# Patient Record
Sex: Female | Born: 1980 | Race: Black or African American | Hispanic: No | Marital: Married | Smoking: Current every day smoker
Health system: Southern US, Community
[De-identification: ages and names within clinical notes are randomized; demographics above are authoritative.]

## PROBLEM LIST (undated history)

## (undated) ENCOUNTER — Inpatient Hospital Stay (HOSPITAL_COMMUNITY): Payer: Self-pay

## (undated) DIAGNOSIS — IMO0002 Reserved for concepts with insufficient information to code with codable children: Secondary | ICD-10-CM

## (undated) DIAGNOSIS — R87619 Unspecified abnormal cytological findings in specimens from cervix uteri: Secondary | ICD-10-CM

## (undated) DIAGNOSIS — R51 Headache: Secondary | ICD-10-CM

## (undated) DIAGNOSIS — B009 Herpesviral infection, unspecified: Secondary | ICD-10-CM

## (undated) DIAGNOSIS — D219 Benign neoplasm of connective and other soft tissue, unspecified: Secondary | ICD-10-CM

## (undated) DIAGNOSIS — B999 Unspecified infectious disease: Secondary | ICD-10-CM

## (undated) DIAGNOSIS — N83209 Unspecified ovarian cyst, unspecified side: Secondary | ICD-10-CM

## (undated) DIAGNOSIS — H3320 Serous retinal detachment, unspecified eye: Secondary | ICD-10-CM

## (undated) HISTORY — PX: TONSILLECTOMY: SUR1361

## (undated) HISTORY — PX: MYOMECTOMY: SHX85

## (undated) HISTORY — PX: OTHER SURGICAL HISTORY: SHX169

---

## 2006-02-12 ENCOUNTER — Inpatient Hospital Stay (HOSPITAL_COMMUNITY): Admission: AD | Admit: 2006-02-12 | Discharge: 2006-02-12 | Payer: Self-pay | Admitting: Obstetrics and Gynecology

## 2006-03-03 ENCOUNTER — Inpatient Hospital Stay (HOSPITAL_COMMUNITY): Admission: AD | Admit: 2006-03-03 | Discharge: 2006-03-03 | Payer: Self-pay | Admitting: Obstetrics & Gynecology

## 2006-03-04 ENCOUNTER — Inpatient Hospital Stay (HOSPITAL_COMMUNITY): Admission: AD | Admit: 2006-03-04 | Discharge: 2006-03-05 | Payer: Self-pay | Admitting: Obstetrics and Gynecology

## 2006-03-04 ENCOUNTER — Encounter (INDEPENDENT_AMBULATORY_CARE_PROVIDER_SITE_OTHER): Payer: Self-pay | Admitting: Specialist

## 2006-03-11 ENCOUNTER — Inpatient Hospital Stay (HOSPITAL_COMMUNITY): Admission: RE | Admit: 2006-03-11 | Discharge: 2006-03-11 | Payer: Self-pay | Admitting: Obstetrics and Gynecology

## 2006-03-18 ENCOUNTER — Inpatient Hospital Stay (HOSPITAL_COMMUNITY): Admission: AD | Admit: 2006-03-18 | Discharge: 2006-03-18 | Payer: Self-pay | Admitting: Obstetrics and Gynecology

## 2006-11-11 ENCOUNTER — Emergency Department (HOSPITAL_COMMUNITY): Admission: EM | Admit: 2006-11-11 | Discharge: 2006-11-11 | Payer: Self-pay | Admitting: Emergency Medicine

## 2007-04-21 ENCOUNTER — Emergency Department (HOSPITAL_COMMUNITY): Admission: EM | Admit: 2007-04-21 | Discharge: 2007-04-21 | Payer: Self-pay | Admitting: Emergency Medicine

## 2008-01-05 ENCOUNTER — Emergency Department (HOSPITAL_COMMUNITY): Admission: EM | Admit: 2008-01-05 | Discharge: 2008-01-06 | Payer: Self-pay | Admitting: Emergency Medicine

## 2008-04-29 ENCOUNTER — Other Ambulatory Visit: Admission: RE | Admit: 2008-04-29 | Discharge: 2008-04-29 | Payer: Self-pay | Admitting: Obstetrics and Gynecology

## 2009-07-11 ENCOUNTER — Emergency Department (HOSPITAL_COMMUNITY): Admission: EM | Admit: 2009-07-11 | Discharge: 2009-07-11 | Payer: Self-pay | Admitting: Family Medicine

## 2009-07-11 ENCOUNTER — Emergency Department (HOSPITAL_COMMUNITY): Admission: EM | Admit: 2009-07-11 | Discharge: 2009-07-11 | Payer: Self-pay | Admitting: Emergency Medicine

## 2009-07-12 ENCOUNTER — Emergency Department (HOSPITAL_COMMUNITY): Admission: EM | Admit: 2009-07-12 | Discharge: 2009-07-12 | Payer: Self-pay | Admitting: Emergency Medicine

## 2009-07-23 ENCOUNTER — Encounter: Admission: RE | Admit: 2009-07-23 | Discharge: 2009-07-23 | Payer: Self-pay | Admitting: Family Medicine

## 2009-08-03 ENCOUNTER — Other Ambulatory Visit: Admission: RE | Admit: 2009-08-03 | Discharge: 2009-08-03 | Payer: Self-pay | Admitting: Obstetrics and Gynecology

## 2009-09-12 ENCOUNTER — Encounter: Admission: RE | Admit: 2009-09-12 | Discharge: 2009-09-12 | Payer: Self-pay | Admitting: Gastroenterology

## 2009-10-07 ENCOUNTER — Emergency Department (HOSPITAL_COMMUNITY)
Admission: EM | Admit: 2009-10-07 | Discharge: 2009-10-07 | Payer: Self-pay | Source: Home / Self Care | Admitting: Emergency Medicine

## 2010-04-02 LAB — BASIC METABOLIC PANEL
Calcium: 9 mg/dL (ref 8.4–10.5)
Chloride: 106 mEq/L (ref 96–112)
GFR calc Af Amer: >60 mL/min (ref >60)
Glucose, Bld: 84 mg/dL (ref 70–99)
Sodium: 139 mEq/L (ref 135–145)

## 2010-04-02 LAB — POCT URINALYSIS DIP (DEVICE)
Nitrite: NEGATIVE
Protein, ur: NEGATIVE mg/dL
Specific Gravity, Urine: >=1.03 (ref 1.005–1.030)
Urobilinogen, UA: 2 mg/dL — ABNORMAL HIGH (ref 0.0–1.0)
pH: 6.5 (ref 5.0–8.0)

## 2010-04-02 LAB — HEPATIC FUNCTION PANEL
ALT: 16 U/L (ref 0–35)
Albumin: 3.9 g/dL (ref 3.5–5.2)
Bilirubin, Direct: 0.1 mg/dL (ref 0.0–0.3)

## 2010-04-02 LAB — CBC
HCT: 38.5 % (ref 36.0–46.0)
Hemoglobin: 13 g/dL (ref 12.0–15.0)
MCV: 87.7 fL (ref 78.0–100.0)
RDW: 14.2 % (ref 11.5–15.5)
WBC: 8.6 10*3/uL (ref 4.0–10.5)

## 2010-04-02 LAB — DIFFERENTIAL
Basophils Relative: 1 % (ref 0–1)
Neutro Abs: 5.4 10*3/uL (ref 1.7–7.7)
Neutrophils Relative %: 63 % (ref 43–77)

## 2010-04-02 LAB — URINALYSIS, ROUTINE W REFLEX MICROSCOPIC
Bilirubin Urine: NEGATIVE
Ketones, ur: NEGATIVE mg/dL
Leukocytes, UA: NEGATIVE
Nitrite: NEGATIVE
Protein, ur: NEGATIVE mg/dL
Specific Gravity, Urine: 1.027 (ref 1.005–1.030)

## 2010-04-02 LAB — URINE MICROSCOPIC-ADD ON

## 2010-04-02 LAB — POCT PREGNANCY, URINE: Preg Test, Ur: NEGATIVE

## 2010-04-24 ENCOUNTER — Inpatient Hospital Stay (HOSPITAL_COMMUNITY): Payer: 59

## 2010-04-24 ENCOUNTER — Inpatient Hospital Stay (HOSPITAL_COMMUNITY)
Admission: AD | Admit: 2010-04-24 | Discharge: 2010-04-24 | Disposition: A | Discharge status: Home / Self Care | Payer: 59 | Source: Ambulatory Visit | Attending: Obstetrics and Gynecology | Admitting: Obstetrics and Gynecology

## 2010-04-24 DIAGNOSIS — O209 Hemorrhage in early pregnancy, unspecified: Secondary | ICD-10-CM | POA: Insufficient documentation

## 2010-04-24 DIAGNOSIS — N760 Acute vaginitis: Secondary | ICD-10-CM | POA: Insufficient documentation

## 2010-04-24 DIAGNOSIS — B9689 Other specified bacterial agents as the cause of diseases classified elsewhere: Secondary | ICD-10-CM | POA: Insufficient documentation

## 2010-04-24 DIAGNOSIS — A499 Bacterial infection, unspecified: Secondary | ICD-10-CM | POA: Insufficient documentation

## 2010-04-24 DIAGNOSIS — D259 Leiomyoma of uterus, unspecified: Secondary | ICD-10-CM | POA: Insufficient documentation

## 2010-04-24 DIAGNOSIS — O3410 Maternal care for benign tumor of corpus uteri, unspecified trimester: Secondary | ICD-10-CM | POA: Insufficient documentation

## 2010-04-24 DIAGNOSIS — O2390 Unspecified genitourinary tract infection in pregnancy, unspecified trimester: Secondary | ICD-10-CM

## 2010-04-24 LAB — URINALYSIS, ROUTINE W REFLEX MICROSCOPIC
Bilirubin Urine: NEGATIVE
Glucose, UA: NEGATIVE mg/dL
Nitrite: NEGATIVE
Urobilinogen, UA: 0.2 mg/dL (ref 0.0–1.0)
pH: 5.5 (ref 5.0–8.0)

## 2010-04-24 LAB — CBC
MCHC: 33.1 g/dL (ref 30.0–36.0)
MCV: 84.6 fL (ref 78.0–100.0)
Platelets: 280 10*3/uL (ref 150–400)
RDW: 14.6 % (ref 11.5–15.5)
WBC: 7.9 10*3/uL (ref 4.0–10.5)

## 2010-04-24 LAB — HCG, QUANTITATIVE, PREGNANCY: hCG, Beta Chain, Quant, S: 15676 m[IU]/mL — ABNORMAL HIGH (ref <5)

## 2010-04-24 LAB — WET PREP, GENITAL
Trich, Wet Prep: NONE SEEN
Yeast Wet Prep HPF POC: NONE SEEN

## 2010-04-24 LAB — URINE MICROSCOPIC-ADD ON

## 2010-04-24 LAB — POCT PREGNANCY, URINE: Preg Test, Ur: POSITIVE

## 2010-04-25 LAB — GC/CHLAMYDIA PROBE AMP, GENITAL: GC Probe Amp, Genital: NEGATIVE

## 2010-04-27 LAB — URINE CULTURE: Colony Count: 65000

## 2010-07-02 ENCOUNTER — Inpatient Hospital Stay (HOSPITAL_COMMUNITY): Payer: 59

## 2010-07-02 ENCOUNTER — Inpatient Hospital Stay (HOSPITAL_COMMUNITY)
Admission: AD | Admit: 2010-07-02 | Discharge: 2010-07-03 | Disposition: A | Discharge status: Home / Self Care | Payer: 59 | Source: Ambulatory Visit | Attending: Obstetrics and Gynecology | Admitting: Obstetrics and Gynecology

## 2010-07-02 DIAGNOSIS — R1031 Right lower quadrant pain: Secondary | ICD-10-CM | POA: Insufficient documentation

## 2010-07-02 DIAGNOSIS — O9989 Other specified diseases and conditions complicating pregnancy, childbirth and the puerperium: Secondary | ICD-10-CM

## 2010-07-02 DIAGNOSIS — O99891 Other specified diseases and conditions complicating pregnancy: Secondary | ICD-10-CM | POA: Insufficient documentation

## 2010-07-02 LAB — CBC
Hemoglobin: 11.6 g/dL — ABNORMAL LOW (ref 12.0–15.0)
MCH: 28.1 pg (ref 26.0–34.0)
MCHC: 33.7 g/dL (ref 30.0–36.0)
Platelets: 292 10*3/uL (ref 150–400)
RDW: 13.2 % (ref 11.5–15.5)

## 2010-07-02 LAB — DIFFERENTIAL
Basophils Absolute: 0 10*3/uL (ref 0.0–0.1)
Basophils Relative: 0 % (ref 0–1)
Eosinophils Absolute: 0.1 10*3/uL (ref 0.0–0.7)
Eosinophils Relative: 1 % (ref 0–5)
Monocytes Absolute: 0.9 10*3/uL (ref 0.1–1.0)
Monocytes Relative: 10 % (ref 3–12)
Neutro Abs: 6.9 10*3/uL (ref 1.7–7.7)

## 2010-07-02 LAB — URINALYSIS, ROUTINE W REFLEX MICROSCOPIC
Bilirubin Urine: NEGATIVE
Nitrite: NEGATIVE
Protein, ur: NEGATIVE mg/dL
Specific Gravity, Urine: >1.03 — ABNORMAL HIGH (ref 1.005–1.030)
Urobilinogen, UA: 0.2 mg/dL (ref 0.0–1.0)

## 2010-07-03 LAB — COMPREHENSIVE METABOLIC PANEL
ALT: 63 U/L — ABNORMAL HIGH (ref 0–35)
AST: 37 U/L (ref 0–37)
Albumin: 2.8 g/dL — ABNORMAL LOW (ref 3.5–5.2)
Alkaline Phosphatase: 67 U/L (ref 39–117)
Calcium: 9.4 mg/dL (ref 8.4–10.5)
Potassium: 3.6 mEq/L (ref 3.5–5.1)
Sodium: 129 mEq/L — ABNORMAL LOW (ref 135–145)
Total Protein: 6.6 g/dL (ref 6.0–8.3)

## 2010-07-03 LAB — WET PREP, GENITAL

## 2010-07-03 LAB — AMYLASE: Amylase: 60 U/L (ref 0–105)

## 2010-07-04 LAB — GC/CHLAMYDIA PROBE AMP, GENITAL
Chlamydia, DNA Probe: NEGATIVE
GC Probe Amp, Genital: NEGATIVE

## 2010-10-05 ENCOUNTER — Encounter (HOSPITAL_COMMUNITY): Payer: Self-pay | Admitting: *Deleted

## 2010-10-05 ENCOUNTER — Inpatient Hospital Stay (HOSPITAL_COMMUNITY)
Admission: AD | Admit: 2010-10-05 | Discharge: 2010-10-05 | Disposition: A | Discharge status: Home / Self Care | Payer: 59 | Source: Ambulatory Visit | Attending: Obstetrics and Gynecology | Admitting: Obstetrics and Gynecology

## 2010-10-05 DIAGNOSIS — O4700 False labor before 37 completed weeks of gestation, unspecified trimester: Secondary | ICD-10-CM | POA: Insufficient documentation

## 2010-10-05 LAB — URINALYSIS, ROUTINE W REFLEX MICROSCOPIC
Bilirubin Urine: NEGATIVE
Hgb urine dipstick: NEGATIVE
Nitrite: NEGATIVE
Specific Gravity, Urine: 1.02 (ref 1.005–1.030)
Urobilinogen, UA: 0.2 mg/dL (ref 0.0–1.0)
pH: 6.5 (ref 5.0–8.0)

## 2010-10-05 LAB — WET PREP, GENITAL: Clue Cells Wet Prep HPF POC: NONE SEEN

## 2010-10-05 LAB — FETAL FIBRONECTIN: Fetal Fibronectin: NEGATIVE

## 2010-10-05 NOTE — Progress Notes (Signed)
At work, started contracting.  Low back pain, some pressure.  q 10-15.

## 2010-10-05 NOTE — ED Provider Notes (Signed)
History     Chief Complaint  Patient presents with  . Labor Eval   HPI [redacted] weeks EGA. Contractions today starting at 3 PM about q 10 - 15 minutes, less now. Sent with orders from Dr. Idamae Schuller. No bleeding or LOF. + fetal movement. Uncomplicated prenatal course.   OB History    Grav Para Term Preterm Abortions TAB SAB Ect Mult Living   3 1 1  0 1 0 1 0 0 1      No past medical history on file.  Past Surgical History  Procedure Date  . Cesarean section   . Tonsillectomy   . Adnoidectomy     Family History  Problem Relation Age of Onset  . Hypertension Mother   . Diabetes Mother     History  Substance Use Topics  . Smoking status: Current Some Day Smoker -- 0.2 packs/day  . Smokeless tobacco: Not on file  . Alcohol Use: No    Allergies:  Allergies  Allergen Reactions  . Hydrocodone Nausea And Vomiting and Swelling    Prescriptions prior to admission  Medication Sig Dispense Refill  . acetaminophen (TYLENOL) 500 MG tablet Take 500 mg by mouth every 6 (six) hours as needed.        . Chlorphen-Pseudoephed-APAP (TYLENOL ALLERGY SINUS PO) Take 1 tablet by mouth daily as needed. Patient is using this medication for cold and sinuses.       . Loratadine (CLARITIN PO) Take 1 tablet by mouth daily as needed. Patient was using this medication for allergies.       . prenatal vitamin w/FE, FA (PRENATAL 1 + 1) 27-1 MG TABS Take 1 tablet by mouth daily.          Review of Systems  Constitutional: Negative.   Respiratory: Negative.   Cardiovascular: Negative.   Gastrointestinal: Negative for nausea, vomiting, abdominal pain, diarrhea and constipation.  Genitourinary: Negative for dysuria, urgency, frequency, hematuria and flank pain.       Negative for vaginal bleeding, Positive for cramping   Musculoskeletal: Negative.   Neurological: Negative.   Psychiatric/Behavioral: Negative.    Physical Exam   Blood pressure 113/63, pulse 95, temperature 97.5 F (36.4 C),  temperature source Oral, resp. rate 16, height 5\' 6"  (1.676 m), weight 90.266 kg (199 lb).  Physical Exam  Constitutional: She is oriented to person, place, and time. She appears well-developed and well-nourished. No distress.  Cardiovascular: Normal rate.   Respiratory: Effort normal.  GI: Soft. There is no tenderness.  Genitourinary:       Closed/thick/high   Musculoskeletal: Normal range of motion.  Neurological: She is alert and oriented to person, place, and time.  Skin: Skin is warm and dry.  Psychiatric: She has a normal mood and affect.    MAU Course  Procedures  Results for orders placed during the hospital encounter of 10/05/10 (from the past 24 hour(s))  URINALYSIS, ROUTINE W REFLEX MICROSCOPIC     Status: Normal   Collection Time   10/05/10  6:10 PM      Component Value Range   Color, Urine YELLOW  YELLOW    Appearance CLEAR  CLEAR    Specific Gravity, Urine 1.020  1.005 - 1.030    pH 6.5  5.0 - 8.0    Glucose, UA NEGATIVE  NEGATIVE (mg/dL)   Hgb urine dipstick NEGATIVE  NEGATIVE    Bilirubin Urine NEGATIVE  NEGATIVE    Ketones, ur NEGATIVE  NEGATIVE (mg/dL)   Protein, ur NEGATIVE  NEGATIVE (mg/dL)   Urobilinogen, UA 0.2  0.0 - 1.0 (mg/dL)   Nitrite NEGATIVE  NEGATIVE    Leukocytes, UA NEGATIVE  NEGATIVE   WET PREP, GENITAL     Status: Abnormal   Collection Time   10/05/10  6:38 PM      Component Value Range   Yeast, Wet Prep NONE SEEN  NONE SEEN    Trich, Wet Prep NONE SEEN  NONE SEEN    Clue Cells, Wet Prep NONE SEEN  NONE SEEN    WBC, Wet Prep HPF POC FEW (*) NONE SEEN   FETAL FIBRONECTIN     Status: Normal   Collection Time   10/05/10  6:38 PM      Component Value Range   Fetal Fibronectin NEGATIVE  NEGATIVE      Assessment and Plan  Threatened preterm labor Reactive NST D/c home with precautions per Dr. Sunday Corn 10/05/2010, 8:17 PM

## 2010-10-10 LAB — CBC
HCT: 38.3
MCHC: 34.3
MCV: 83.1
RBC: 4.61
WBC: 5.2

## 2010-10-10 LAB — BASIC METABOLIC PANEL
BUN: 13
CO2: 24
Calcium: 8.6
Creatinine, Ser: 0.78
GFR calc non Af Amer: >60
Glucose, Bld: 89

## 2010-10-10 LAB — DIFFERENTIAL
Basophils Relative: 1
Eosinophils Absolute: 0.1
Eosinophils Relative: 1
Lymphs Abs: 1.3
Monocytes Relative: 10

## 2010-10-20 LAB — POCT I-STAT, CHEM 8
BUN: 14 mg/dL (ref 6–23)
Creatinine, Ser: 0.8 mg/dL (ref 0.4–1.2)
Glucose, Bld: 78 mg/dL (ref 70–99)
Sodium: 138 mEq/L (ref 135–145)
TCO2: 26 mmol/L (ref 0–100)

## 2010-10-20 LAB — URINALYSIS, ROUTINE W REFLEX MICROSCOPIC
Bilirubin Urine: NEGATIVE
Hgb urine dipstick: NEGATIVE
Protein, ur: NEGATIVE mg/dL
Urobilinogen, UA: 0.2 mg/dL (ref 0.0–1.0)

## 2010-11-03 ENCOUNTER — Encounter (HOSPITAL_COMMUNITY): Payer: Self-pay

## 2010-11-03 ENCOUNTER — Inpatient Hospital Stay (HOSPITAL_COMMUNITY)
Admission: AD | Admit: 2010-11-03 | Discharge: 2010-11-04 | Disposition: A | Discharge status: Other Acute Inpatient Hospital | Payer: 59 | Source: Ambulatory Visit | Attending: Obstetrics and Gynecology | Admitting: Obstetrics and Gynecology

## 2010-11-03 DIAGNOSIS — O4700 False labor before 37 completed weeks of gestation, unspecified trimester: Secondary | ICD-10-CM | POA: Insufficient documentation

## 2010-11-03 HISTORY — DX: Herpesviral infection, unspecified: B00.9

## 2010-11-03 NOTE — Progress Notes (Signed)
Patient states that her "water broke" at 2300pm. Clear fluids.  She states that was having contractions q 5-83minutes apart an hour prior to the rom. She reports good fetal movement. She denies any complications with this pregnancy. She is states that she plans a repeat c-section.

## 2010-11-04 ENCOUNTER — Inpatient Hospital Stay (HOSPITAL_COMMUNITY): Payer: 59

## 2010-11-04 LAB — RPR: RPR Ser Ql: NONREACTIVE

## 2010-11-04 LAB — CBC
HCT: 34 % — ABNORMAL LOW (ref 36.0–46.0)
Hemoglobin: 11.1 g/dL — ABNORMAL LOW (ref 12.0–15.0)
MCH: 28 pg (ref 26.0–34.0)
MCHC: 32.6 g/dL (ref 30.0–36.0)
RDW: 13.6 % (ref 11.5–15.5)

## 2010-11-04 LAB — WET PREP, GENITAL: Trich, Wet Prep: NONE SEEN

## 2010-11-04 LAB — GC/CHLAMYDIA PROBE AMP, GENITAL: GC Probe Amp, Genital: NEGATIVE

## 2010-11-04 MED ORDER — FAMOTIDINE IN NACL 20-0.9 MG/50ML-% IV SOLN: 20.0000 mg | Freq: Once | INTRAVENOUS | Status: DC

## 2010-11-04 MED ORDER — CITRIC ACID-SODIUM CITRATE 334-500 MG/5ML PO SOLN: 30.0000 mL | Freq: Once | ORAL | Status: DC

## 2010-11-04 MED ORDER — NIFEDIPINE 10 MG PO CAPS
20.0000 mg | ORAL_CAPSULE | Freq: Once | ORAL | Status: AC
  Administered 2010-11-04: 20 mg via ORAL
  Filled 2010-11-04: qty 2

## 2010-11-04 MED ORDER — BETAMETHASONE SOD PHOS & ACET 6 (3-3) MG/ML IJ SUSP
12.0000 mg | Freq: Once | INTRAMUSCULAR | Status: AC
  Administered 2010-11-04: 12 mg via INTRAMUSCULAR
  Filled 2010-11-04: qty 2

## 2010-11-04 MED ORDER — LACTATED RINGERS IV BOLUS (SEPSIS)
500.0000 mL | Freq: Once | INTRAVENOUS | Status: AC
  Administered 2010-11-04: 500 mL via INTRAVENOUS

## 2010-11-04 NOTE — H&P (Addendum)
Erika Gonzales is a 30 y.o. female 66 2/7 weeks presenting for LOF at 2300.  Contractions prior to LOF, increased in severity after rupture. Pregnancy uncomplicated. H/o previous c-section 10 years ago due to cord around the neck. Declines VBAC. 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  Pt denies prodromal symptoms.  Maternal Medical History:  Reason for admission: Reason for admission: rupture of membranes.  Reason for Admission:   nauseaContractions: Onset was 1-2 hours ago.   Frequency: regular.   Perceived severity is moderate.   7/10 pain   Prenatal complications: no prenatal complications Prenatal Complications - Diabetes: none.    OB History    Grav Para Term Preterm Abortions TAB SAB Ect Mult Living   4 1 1  0 1 0 1 0 0 1     Past Medical History  Diagnosis Date  . HSV-2 infection    Past Surgical History  Procedure Date  . Cesarean section   . Tonsillectomy   . Adnoidectomy    Family History: family history includes Diabetes in her mother and Hypertension in her mother. Social History:  reports that she has been smoking Cigarettes.  She has been smoking about .25 packs per day. She does not have any smokeless tobacco history on file. She reports that she does not drink alcohol or use illicit drugs.  Review of Systems  Constitutional: Negative.  Negative for fever and chills.  Cardiovascular: Negative for chest pain.  Gastrointestinal: Negative for nausea, vomiting and diarrhea.    Dilation: Fingertip Effacement (%): Thick Station:  (high) Exam by:: dr Dion Body Blood pressure 138/65, pulse 101, temperature 98.3 F (36.8 C), temperature source Oral, resp. rate 18, height 5\' 6"  (1.676 m), weight 90.719 kg (200 lb), SpO2 98.00%. Maternal Exam:  Uterine Assessment: Contraction frequency is regular.   Abdomen: Fetal presentation: no presenting part  Introitus: Normal vulva. Ferning test: not done.  Nitrazine test: not done. Amniotic fluid character: clear. Grossly  ruptured.  Cervix: Cervix evaluated by digital exam.   FT, soft, posterior  Fetal Exam Fetal Monitor Review: Baseline rate: 130s.  Variability: moderate (6-25 bpm).   Pattern: accelerations present.    Fetal State Assessment: Category I - tracings are normal.    No vulvar lesions, cervix not well visualized due to copious fluid. Abdomen soft, nontender. Pt comfortable. Physical Exam  Constitutional: She is oriented to person, place, and time. She appears well-developed and well-nourished.  HENT:  Head: Normocephalic and atraumatic.  Eyes: Conjunctivae and EOM are normal.  Neck: Normal range of motion.  GI: There is no tenderness.  Neurological: She is alert and oriented to person, place, and time.    Prenatal labs: ABO, Rh: --/--/O POS (04/09 2115) Antibody:   Rubella:   RPR:    HBsAg:    HIV:    GBS:     Assessment/Plan: Pregnancy at 33 2/7 weeks. SROM, contractions.  Contractions spaced out after IVF. H/o prevous c-section-declined VBAC. H/o HSV- no active infection. Wet prep, GC/Chl done, GBS culture.Ultrasound, CBC, RPR.  Due to high census in NICU, Dr. Katrinka Blazing recommends pt be transferred if appropriate. Spoke with Dr. Janie Morning at [facility].  Accepted transfer.  Would like pt to get BMZ prior to transfer.  Will observe pt upon arrival and do c-section only if indicated (labor, fetal distress, etc.)  Discussed above with pt.  Encourage pt to consider VBAC. All questions answered.  Pt agreeable to transfer.   [name] 11/04/2010, 12:16 AM

## 2010-11-07 LAB — CULTURE, BETA STREP (GROUP B ONLY)

## 2010-12-18 ENCOUNTER — Encounter (HOSPITAL_COMMUNITY): Admission: RE | Payer: Self-pay | Source: Ambulatory Visit

## 2010-12-18 ENCOUNTER — Inpatient Hospital Stay (HOSPITAL_COMMUNITY): Admission: RE | Admit: 2010-12-18 | Payer: 59 | Source: Ambulatory Visit | Admitting: Obstetrics and Gynecology

## 2010-12-18 SURGERY — Surgical Case
Anesthesia: Regional

## 2011-03-01 ENCOUNTER — Encounter (HOSPITAL_COMMUNITY): Payer: Self-pay | Admitting: *Deleted

## 2011-04-23 ENCOUNTER — Encounter (HOSPITAL_COMMUNITY): Payer: Self-pay | Admitting: Emergency Medicine

## 2011-04-23 ENCOUNTER — Emergency Department (HOSPITAL_COMMUNITY): Payer: 59

## 2011-04-23 ENCOUNTER — Emergency Department (HOSPITAL_COMMUNITY)
Admission: EM | Admit: 2011-04-23 | Discharge: 2011-04-23 | Disposition: A | Discharge status: Home / Self Care | Payer: 59 | Attending: Emergency Medicine | Admitting: Emergency Medicine

## 2011-04-23 DIAGNOSIS — R110 Nausea: Secondary | ICD-10-CM | POA: Insufficient documentation

## 2011-04-23 DIAGNOSIS — R0602 Shortness of breath: Secondary | ICD-10-CM | POA: Insufficient documentation

## 2011-04-23 DIAGNOSIS — B349 Viral infection, unspecified: Secondary | ICD-10-CM

## 2011-04-23 DIAGNOSIS — R10819 Abdominal tenderness, unspecified site: Secondary | ICD-10-CM | POA: Insufficient documentation

## 2011-04-23 DIAGNOSIS — R05 Cough: Secondary | ICD-10-CM | POA: Insufficient documentation

## 2011-04-23 DIAGNOSIS — K469 Unspecified abdominal hernia without obstruction or gangrene: Secondary | ICD-10-CM | POA: Insufficient documentation

## 2011-04-23 DIAGNOSIS — B9789 Other viral agents as the cause of diseases classified elsewhere: Secondary | ICD-10-CM | POA: Insufficient documentation

## 2011-04-23 DIAGNOSIS — R059 Cough, unspecified: Secondary | ICD-10-CM | POA: Insufficient documentation

## 2011-04-23 LAB — CBC
HCT: 39.6 % (ref 36.0–46.0)
Hemoglobin: 13 g/dL (ref 12.0–15.0)
MCH: 27.8 pg (ref 26.0–34.0)
MCHC: 32.8 g/dL (ref 30.0–36.0)
MCV: 84.6 fL (ref 78.0–100.0)
Platelets: 353 K/uL (ref 150–400)
RBC: 4.68 MIL/uL (ref 3.87–5.11)
RDW: 14.5 % (ref 11.5–15.5)
WBC: 6.6 K/uL (ref 4.0–10.5)

## 2011-04-23 LAB — POCT I-STAT, CHEM 8
Chloride: 106 mEq/L (ref 96–112)
Glucose, Bld: 88 mg/dL (ref 70–99)

## 2011-04-23 LAB — URINALYSIS, ROUTINE W REFLEX MICROSCOPIC
Bilirubin Urine: NEGATIVE
Glucose, UA: NEGATIVE mg/dL
Ketones, ur: NEGATIVE mg/dL
pH: 6.5 (ref 5.0–8.0)

## 2011-04-23 LAB — DIFFERENTIAL
Basophils Relative: 0 % (ref 0–1)
Eosinophils Absolute: 0.1 10*3/uL (ref 0.0–0.7)
Eosinophils Relative: 1 % (ref 0–5)
Monocytes Absolute: 0.4 10*3/uL (ref 0.1–1.0)
Monocytes Relative: 5 % (ref 3–12)

## 2011-04-23 LAB — POCT PREGNANCY, URINE: Preg Test, Ur: NEGATIVE

## 2011-04-23 MED ORDER — ONDANSETRON 8 MG PO TBDP
8.0000 mg | ORAL_TABLET | Freq: Once | ORAL | Status: AC
  Administered 2011-04-23: 8 mg via ORAL
  Filled 2011-04-23: qty 1

## 2011-04-23 MED ORDER — OXYCODONE-ACETAMINOPHEN 5-325 MG PO TABS
2.0000 | ORAL_TABLET | Freq: Once | ORAL | Status: AC
  Administered 2011-04-23: 2 via ORAL
  Filled 2011-04-23: qty 2

## 2011-04-23 MED ORDER — OXYCODONE-ACETAMINOPHEN 5-325 MG PO TABS: 2.0000 | ORAL_TABLET | ORAL | Status: AC | PRN

## 2011-04-23 MED ORDER — ONDANSETRON HCL 4 MG PO TABS: 8.0000 mg | ORAL_TABLET | Freq: Four times a day (QID) | ORAL | Status: AC

## 2011-04-23 NOTE — ED Provider Notes (Cosign Needed)
History     CSN: [id number]  Arrival date & time 04/23/11  1604   First MD Initiated Contact with Patient 04/23/11 1759      Chief Complaint  Patient presents with  . Abdominal Pain  . Nausea    (Consider location/radiation/quality/duration/timing/severity/associated sxs/prior treatment) HPI Complains of bilateral flank pain, left side greater than right accompanied by epigastric pain nausea and cough productive of yellow sputum onset 3 days ago. Epigastric pain is at site of hernia which she's had since her prior cesarean section. Flank pain is worse when she lies on her left side. Improved with the other positions treated with Aleve with partial relief maximum temperature 100. Cough is productive of yellow sputum denies shortness of breath denies other complaint or associated symptoms. Admits to nausea no vomiting last menstrual period January 2013 last bowel movement yesterday normal Past Medical History  Diagnosis Date  . HSV-2 infection     Past Surgical History  Procedure Date  . Cesarean section   . Tonsillectomy   . Adnoidectomy    Hernia Family History  Problem Relation Age of Onset  . Hypertension Mother   . Diabetes Mother     History  Substance Use Topics  . Smoking status: Current Some Day Smoker -- 0.2 packs/day    Types: Cigarettes  . Smokeless tobacco: Not on file  . Alcohol Use: Yes     weekends    OB History    Grav Para Term Preterm Abortions TAB SAB Ect Mult Living   4 1 1  0 1 0 1 0 0 1      Review of Systems  Constitutional: Negative.   HENT: Negative.   Respiratory: Positive for cough.   Cardiovascular: Negative.   Gastrointestinal: Positive for nausea and abdominal pain.  Genitourinary: Positive for flank pain.  Musculoskeletal: Negative.   Skin: Negative.   Neurological: Negative.   Hematological: Negative.   Psychiatric/Behavioral: Negative.   All other systems reviewed and are negative.    Allergies  Banana and  Hydrocodone  Home Medications   Current Outpatient Rx  Name Route Sig Dispense Refill  . ACETAMINOPHEN 500 MG PO TABS Oral Take 500 mg by mouth every 6 (six) hours as needed. For pain     . TYLENOL ALLERGY SINUS PO Oral Take 1 tablet by mouth daily as needed. Patient is using this medication for cold and sinuses.     Marland Kitchen LORATADINE 10 MG PO TABS Oral Take 10 mg by mouth daily.    Marland Kitchen PRENATAL PLUS 27-1 MG PO TABS Oral Take 1 tablet by mouth daily.        BP 120/72  Pulse 79  Temp(Src) 99.4 F (37.4 C) (Oral)  Resp 18  SpO2 100%  Physical Exam  Nursing note and vitals reviewed. Constitutional: She appears well-developed and well-nourished.  HENT:  Head: Normocephalic and atraumatic.  Eyes: Conjunctivae are normal. Pupils are equal, round, and reactive to light.  Neck: Neck supple. No tracheal deviation present. No thyromegaly present.  Cardiovascular: Normal rate and regular rhythm.   No murmur heard. Pulmonary/Chest: Effort normal and breath sounds normal.  Abdominal: Soft. Bowel sounds are normal. She exhibits mass. She exhibits no distension. There is tenderness. There is no rebound and no guarding.       Tender midline with ventral hernia, tender, easily reducible, not red or warm  Genitourinary:       Left flank tenderness  Musculoskeletal: Normal range of motion. She exhibits no edema and no  tenderness.  Neurological: She is alert. Coordination normal.  Skin: Skin is warm and dry. No rash noted.  Psychiatric: She has a normal mood and affect.    ED Course  Procedures (including critical care time)  Labs Reviewed  URINALYSIS, ROUTINE W REFLEX MICROSCOPIC - Abnormal; Notable for the following:    APPearance CLOUDY (*)    Specific Gravity, Urine 1.034 (*)    All other components within normal limits  POCT I-STAT, CHEM 8 - Abnormal; Notable for the following:    Calcium, Ion 1.11 (*)    All other components within normal limits  CBC  DIFFERENTIAL  POCT PREGNANCY,  URINE   No results found.   No diagnosis found.  Feels improved after treatment with Percocet and Zofran  MDM  Hernia is not incarcerated Symptoms likely due to viral illness with productive cough and myalgias, and nausea Plan prescriptions Percocet, Zofran Followup Dr.Barnes not better one week Referral [facility] for elective hernia repair Diagnosis #1 viral illness #2 ventral hernia        Doug Sou, MD 04/23/11 2128

## 2011-04-23 NOTE — Discharge Instructions (Signed)
Take Tylenol for mild pain or the pain medicine prescribed for bad pain. You've also been prescribed medicine for nausea to take as needed. Call Dr. Zachery Dauer if not improved in a week. Call [facility] at [phone] if you wish to get your hernia repaired to schedule an office appointment. If your hernia comes out and won't go back in and becomes extremely painful, come to the emergency department immediately

## 2011-04-23 NOTE — ED Notes (Signed)
Pt presenting to ed with c/o abdominal pain with positive nausea pt denies diarrhea. Pt is alert and oriented at this time

## 2011-04-23 NOTE — ED Notes (Signed)
Pt c/o RUQ hernia x6 months. More painful/sore in last few months. In last few days, pain has begun moving into back, L kidney area. Hernia area tender to palpation. Pt denies urinary, vaginal symptoms. +nausea, denies vomiting.

## 2011-08-14 ENCOUNTER — Other Ambulatory Visit (HOSPITAL_COMMUNITY)
Admission: RE | Admit: 2011-08-14 | Discharge: 2011-08-14 | Disposition: A | Discharge status: Home / Self Care | Payer: 59 | Source: Ambulatory Visit | Attending: Obstetrics and Gynecology | Admitting: Obstetrics and Gynecology

## 2011-08-14 ENCOUNTER — Other Ambulatory Visit: Payer: Self-pay | Admitting: Obstetrics and Gynecology

## 2011-08-14 DIAGNOSIS — N760 Acute vaginitis: Secondary | ICD-10-CM | POA: Insufficient documentation

## 2011-08-14 DIAGNOSIS — Z01419 Encounter for gynecological examination (general) (routine) without abnormal findings: Secondary | ICD-10-CM | POA: Insufficient documentation

## 2011-08-14 DIAGNOSIS — Z113 Encounter for screening for infections with a predominantly sexual mode of transmission: Secondary | ICD-10-CM | POA: Insufficient documentation

## 2012-06-18 ENCOUNTER — Emergency Department (INDEPENDENT_AMBULATORY_CARE_PROVIDER_SITE_OTHER)
Admission: EM | Admit: 2012-06-18 | Discharge: 2012-06-18 | Disposition: A | Discharge status: Home / Self Care | Payer: 59 | Source: Home / Self Care | Attending: Emergency Medicine | Admitting: Emergency Medicine

## 2012-06-18 ENCOUNTER — Encounter (HOSPITAL_COMMUNITY): Payer: Self-pay | Admitting: Emergency Medicine

## 2012-06-18 DIAGNOSIS — S335XXA Sprain of ligaments of lumbar spine, initial encounter: Secondary | ICD-10-CM

## 2012-06-18 DIAGNOSIS — H01009 Unspecified blepharitis unspecified eye, unspecified eyelid: Secondary | ICD-10-CM

## 2012-06-18 DIAGNOSIS — H01006 Unspecified blepharitis left eye, unspecified eyelid: Secondary | ICD-10-CM

## 2012-06-18 MED ORDER — IBUPROFEN 800 MG PO TABS: 800.0000 mg | ORAL_TABLET | Freq: Three times a day (TID) | ORAL | Status: AC | PRN

## 2012-06-18 MED ORDER — TOBRAMYCIN 0.3 % OP SOLN: 1.0000 [drp] | Freq: Four times a day (QID) | OPHTHALMIC | Status: AC

## 2012-06-18 MED ORDER — CYCLOBENZAPRINE HCL 10 MG PO TABS: 10.0000 mg | ORAL_TABLET | Freq: Three times a day (TID) | ORAL | Status: AC | PRN

## 2012-06-18 NOTE — ED Provider Notes (Signed)
History     CSN: [id number]  Arrival date & time 06/18/12  1022   First MD Initiated Contact with Patient 06/18/12 1125      Chief Complaint  Patient presents with  . Conjunctivitis  . Back Pain    (Consider location/radiation/quality/duration/timing/severity/associated sxs/prior treatment) HPI Comments: Patient presents urgent care complaining of a pink eye and soreness on her left lower eyelid. She's also having some left maxillary pressure/congestion. Denies any eye injury or trauma does wear contacts glasses. No changes to her physician, headaches or fevers.  Patient also is reporting back pain she recently moved to a second floor building and is reporting bid back pain. Pain exacerbates with movements and activity especially leaning forward. She denies any direct trauma or injury, she tends to have back problems in, and go. She denies any associated symptoms with it such as weakness, paresthesias or urinary or fecal incontinence.  Patient is a 32 y.o. female presenting with conjunctivitis and back pain. The history is provided by the patient.  Conjunctivitis This is a new problem. The problem occurs constantly. The problem has been gradually worsening. Pertinent negatives include no abdominal pain, no headaches and no shortness of breath. Exacerbated by: Blinking and twitching her eyelid. Nothing (Try to use over-the-counter Visine eyedrops) relieves the symptoms. The treatment provided no relief.  Back Pain Associated symptoms: no abdominal pain, no fever, no headaches, no numbness and no weakness     Past Medical History  Diagnosis Date  . HSV-2 infection     Past Surgical History  Procedure Laterality Date  . Cesarean section    . Tonsillectomy    . Adnoidectomy      Family History  Problem Relation Age of Onset  . Hypertension Mother   . Diabetes Mother     History  Substance Use Topics  . Smoking status: Current Some Day Smoker -- 0.25 packs/day    Types:  Cigarettes  . Smokeless tobacco: Not on file  . Alcohol Use: Yes     Comment: weekends    OB History   Grav Para Term Preterm Abortions TAB SAB Ect Mult Living   4 1 1  0 1 0 1 0 0 1      Review of Systems  Constitutional: Positive for activity change. Negative for fever and fatigue.  Eyes: Positive for pain, redness and itching. Negative for photophobia, discharge and visual disturbance.  Respiratory: Negative for cough and shortness of breath.   Gastrointestinal: Negative for abdominal pain.  Musculoskeletal: Positive for back pain. Negative for myalgias, joint swelling and gait problem.  Skin: Negative for color change, pallor, rash and wound.  Neurological: Negative for dizziness, tremors, weakness, numbness and headaches.    Allergies  Banana and Hydrocodone  Home Medications   Current Outpatient Rx  Name  Route  Sig  Dispense  Refill  . Chlorphen-Pseudoephed-APAP (TYLENOL ALLERGY SINUS PO)   Oral   Take 1 tablet by mouth daily as needed. Patient is using this medication for cold and sinuses.          . cyclobenzaprine (FLEXERIL) 10 MG tablet   Oral   Take 1 tablet (10 mg total) by mouth 3 (three) times daily as needed for muscle spasms.   21 tablet   0   . ibuprofen (ADVIL,MOTRIN) 800 MG tablet   Oral   Take 1 tablet (800 mg total) by mouth every 8 (eight) hours as needed for pain.   15 tablet   0   .  loratadine (CLARITIN) 10 MG tablet   Oral   Take 10 mg by mouth daily.         . prenatal vitamin w/FE, FA (PRENATAL 1 + 1) 27-1 MG TABS   Oral   Take 1 tablet by mouth daily.           Marland Kitchen tobramycin (TOBREX) 0.3 % ophthalmic solution   Left Eye   Place 1 drop into the left eye every 6 (six) hours.   5 mL   0     BP 101/78  Pulse 75  Temp(Src) 98.5 F (36.9 C) (Oral)  Resp 20  SpO2 100%  Physical Exam  Nursing note and vitals reviewed. Constitutional: Vital signs are normal. She appears well-developed and well-nourished.  Non-toxic  appearance. She does not have a sickly appearance. She does not appear ill. No distress.  Eyes: Lids are normal. Right eye exhibits no discharge. Left eye exhibits no chemosis, no discharge and no exudate. Left conjunctiva is injected. Left conjunctiva has no hemorrhage. No scleral icterus. Left eye exhibits normal extraocular motion and no nystagmus. Left pupil is round and reactive.    Musculoskeletal: She exhibits tenderness.       Lumbar back: She exhibits decreased range of motion, tenderness and pain. She exhibits no swelling, no edema, no deformity, no spasm and normal pulse.       Back:  Neurological: She is alert.  Skin: No rash noted. No erythema.    ED Course  Procedures (including critical care time)  Labs Reviewed - No data to display No results found.   1. Blepharitis of left eye   2. Lumbar sprain, initial encounter       MDM  Problem #1 conjunctival hyperemia with associated lower eyelid blepharitis. Patient was instructed to remove her contact lenses and to start with ophthalmic antibiotic ( tobramycin), encourage her to simultaneously hydrate her eye with plain visine.   Problem #2 lumbar sprain. Patient instructed to take ibuprofen every 8 hours for 5 days as a structure regimen along with muscle relaxer has been advised of potential side effects of muscle relaxer including drowsiness and dizziness to take extra precautions to drive or operate machinery.   Discuss symptoms that should warrant further evaluation with either of [place] or her primary care Dr. for both conditions        Jimmie Molly, MD 06/18/12 1152

## 2012-06-18 NOTE — ED Notes (Signed)
Pt c/o poss left pink eye onset yest... Also woke up this am w/pain on left side  Also c/o general intermittent back pains x1 year  She is alert and oriented w/no signs of acute distress.

## 2012-09-18 ENCOUNTER — Inpatient Hospital Stay (HOSPITAL_COMMUNITY): Payer: 59

## 2012-09-18 ENCOUNTER — Encounter (HOSPITAL_COMMUNITY): Payer: Self-pay | Admitting: *Deleted

## 2012-09-18 ENCOUNTER — Inpatient Hospital Stay (HOSPITAL_COMMUNITY)
Admission: AD | Admit: 2012-09-18 | Discharge: 2012-09-18 | Disposition: A | Discharge status: Home / Self Care | Payer: 59 | Source: Ambulatory Visit | Attending: Obstetrics and Gynecology | Admitting: Obstetrics and Gynecology

## 2012-09-18 DIAGNOSIS — O209 Hemorrhage in early pregnancy, unspecified: Secondary | ICD-10-CM | POA: Insufficient documentation

## 2012-09-18 HISTORY — DX: Unspecified ovarian cyst, unspecified side: N83.209

## 2012-09-18 HISTORY — DX: Headache: R51

## 2012-09-18 HISTORY — DX: Benign neoplasm of connective and other soft tissue, unspecified: D21.9

## 2012-09-18 HISTORY — DX: Unspecified abnormal cytological findings in specimens from cervix uteri: R87.619

## 2012-09-18 HISTORY — DX: Reserved for concepts with insufficient information to code with codable children: IMO0002

## 2012-09-18 HISTORY — DX: Unspecified infectious disease: B99.9

## 2012-09-18 NOTE — MAU Note (Addendum)
Started spotting over the weekend, now passing clots. No prior bleeding this preg. Cramping, low back pain. Tried to call office was unable to reach anyone.

## 2012-09-18 NOTE — MAU Provider Note (Signed)
Chief Complaint: Threatened Miscarriage   First Provider Initiated Contact with Patient 09/18/12 1133     SUBJECTIVE HPI: Erika Gonzales is a 32 y.o. [id number] at 13 wks who presents with vaginal bleeding. Onset was  08/03/2012 and she was seen in office and had a pelvic exam earlier this week. She came in today because she thought the bleeding was heavier this morning, but has had no clots or need to wear a pad. Minimal lower abdominal cramping. On pelvic rest. No irritative vaginal discharge.  Past Medical History  Diagnosis Date  . HSV-2 infection   . Fibroid   . Headache(784.0)   . Ovarian cyst   . Abnormal Pap smear     HPV  . Infection     UTI, chlamydia   OB History  Gravida Para Term Preterm AB SAB TAB Ectopic Multiple Living  4 2 1  0 1 1 0 0 0 1    # Outcome Date GA Lbr Len/2nd Weight Sex Delivery Anes PTL Lv  4 CUR           3 PAR 11/09/10    M LTCS  Y      Comments: BP high at the end  2 TRM 06/07/00    F LTCS  N Y     Comments: distress  1 SAB              Past Surgical History  Procedure Laterality Date  . Cesarean section    . Tonsillectomy    . Adnoidectomy    . Myomectomy     History   Social History  . Marital Status: Single    Spouse Name: N/A    Number of Children: N/A  . Years of Education: N/A   Occupational History  . Not on file.   Social History Main Topics  . Smoking status: Current Some Day Smoker -- 0.25 packs/day for 12 years    Types: Cigarettes  . Smokeless tobacco: Never Used  . Alcohol Use: Yes     Comment: weekends; not with preg  . Drug Use: No  . Sexual Activity: Yes   Other Topics Concern  . Not on file   Social History Narrative  . No narrative on file   No current facility-administered medications on file prior to encounter.   Current Outpatient Prescriptions on File Prior to Encounter  Medication Sig Dispense Refill  . prenatal vitamin w/FE, FA (PRENATAL 1 + 1) 27-1 MG TABS Take 1 tablet by mouth daily.          Allergies  Allergen Reactions  . Banana Itching  . Hydrocodone Itching, Nausea And Vomiting and Swelling    ROS: Pertinent items in HPI  OBJECTIVE Blood pressure 123/64, pulse 81, temperature 98.7 F (37.1 C), temperature source Oral, height 5\' 5"  (1.651 m), weight 88.451 kg (195 lb). GENERAL: Well-developed, well-nourished female in no acute distress.  HEENT: Normocephalic HEART: normal rate RESP: normal effort ABDOMEN: Soft, non-tender, S=D EXTREMITIES: Nontender, no edema NEURO: Alert and oriented VE: cx closed/thick; scant brown blood present   IMAGING  Clinical Data: Vaginal bleeding. Unsure LMP.  OBSTETRIC <14 WK ULTRASOUND  Technique: Transabdominal ultrasound was performed for evaluation of the gestation as well as the maternal uterus and adnexal regions.  Comparison: None.  Intrauterine gestational sac: Visualized/normal in shape. Yolk sac: Not seen Embryo: Seen Cardiac Activity: Seen Heart Rate: 165 bpm  CRL: 7.7 mm 13 w 6 d [place] EDC: 03/20/13  Maternal uterus/Adnexae: Both ovaries  have a normal appearance with the right ovary measuring 2.9 x 2.0 x 1.9 cm and the left ovary measuring 1.0 x 2.9 x 1.8 cm.  IMPRESSION: Single living intrauterine pregnancy demonstrating an estimated gestational age by ultrasound of 13 weeks 6 days. This correlates well with this expected estimated gestational age by LMP of 14 weeks 1 day and corresponding EDC of 03/18/2013.  Normal ovaries.   Original Report Authenticated By: Rhodia Albright, M.D.             MAU COURSE C/W Dr. Senaida Ores offer [place]   ASSESSMENT 1. Bleeding in early pregnancy   Viable IUP [redacted]w[redacted]d  PLAN Discharge home with reassurance    Medication List         acetaminophen 500 MG tablet  Commonly known as:  TYLENOL  Take 1,000 mg by mouth every 6 (six) hours as needed for pain.     prenatal vitamin w/FE, FA 27-1 MG Tabs tablet  Take 1 tablet by mouth daily.        Follow-up  Information   Follow up with Oliver Pila, MD. (Keep your scheduled appointment)    Specialty:  Obstetrics and Gynecology   Contact information:   [address], [address] [place] [address] [phone]      Danae Orleans, CNM 09/18/2012  11:44 AM

## 2012-10-08 ENCOUNTER — Observation Stay (HOSPITAL_COMMUNITY)
Admission: AD | Admit: 2012-10-08 | Discharge: 2012-10-08 | DRG: 779 | Disposition: A | Discharge status: Home / Self Care | Payer: 59 | Source: Ambulatory Visit | Attending: Obstetrics and Gynecology | Admitting: Obstetrics and Gynecology

## 2012-10-08 ENCOUNTER — Encounter (HOSPITAL_COMMUNITY): Payer: Self-pay

## 2012-10-08 DIAGNOSIS — O034 Incomplete spontaneous abortion without complication: Principal | ICD-10-CM | POA: Diagnosis present

## 2012-10-08 DIAGNOSIS — O039 Complete or unspecified spontaneous abortion without complication: Secondary | ICD-10-CM

## 2012-10-08 MED ORDER — IBUPROFEN 600 MG PO TABS
600.0000 mg | ORAL_TABLET | Freq: Four times a day (QID) | ORAL | Status: DC | PRN
  Administered 2012-10-08: 600 mg via ORAL
  Filled 2012-10-08 (×2): qty 1

## 2012-10-08 MED ORDER — OXYCODONE-ACETAMINOPHEN 5-325 MG PO TABS: 1.0000 | ORAL_TABLET | ORAL | Status: DC | PRN

## 2012-10-08 MED ORDER — IBUPROFEN 600 MG PO TABS: 600.0000 mg | ORAL_TABLET | Freq: Four times a day (QID) | ORAL | Status: DC

## 2012-10-08 MED ORDER — PNEUMOCOCCAL 13-VAL CONJ VACC IM SUSP
0.5000 mL | INTRAMUSCULAR | Status: DC
  Filled 2012-10-08: qty 0.5

## 2012-10-08 MED ORDER — OXYCODONE-ACETAMINOPHEN 5-325 MG PO TABS
1.0000 | ORAL_TABLET | ORAL | Status: DC | PRN
  Administered 2012-10-08: 1 via ORAL
  Filled 2012-10-08: qty 1

## 2012-10-08 MED ORDER — INFLUENZA VAC SPLIT QUAD 0.5 ML IM SUSP
0.5000 mL | INTRAMUSCULAR | Status: AC
  Administered 2012-10-08: 0.5 mL via INTRAMUSCULAR
  Filled 2012-10-08: qty 0.5

## 2012-10-08 MED ORDER — INFLUENZA VAC SPLIT QUAD 0.5 ML IM SUSP: 0.5000 mL | INTRAMUSCULAR | Status: DC

## 2012-10-08 MED ORDER — PNEUMOCOCCAL VAC POLYVALENT 25 MCG/0.5ML IJ INJ
0.5000 mL | INJECTION | INTRAMUSCULAR | Status: AC
  Administered 2012-10-08: 0.5 mL via INTRAMUSCULAR
  Filled 2012-10-08: qty 0.5

## 2012-10-08 NOTE — Discharge Summary (Signed)
Physician Discharge Summary  Patient ID: Erika Gonzales MRN: [id number] DOB/AGE: 1981-01-06 32 y.o.  Admit date: 10/08/2012 Discharge date: 10/08/2012  Admission Diagnoses:  [redacted] week gestation                                           Advanced cervical dilation  Discharge Diagnoses:   Vaginal delivery Active Problems:   * No active hospital problems. *   Discharged Condition: good  Hospital Course: Pt sent from office where presented with vaginal bleeding and found to have a dilated cervix with bag and fetus in vagina.  Reports some cramping prior.  She came to hospital  and quickly had ROM followed by a delivery of a non-viable fetus.  Placenta delivered with the baby.  She was observed for 4 hours and requested d/c home. The fetus did appear to have an abnormal neck and was sent to pathology for evaluation. }    Discharge Exam: Blood pressure 115/61, pulse 75, temperature 99.1 F (37.3 C), temperature source Oral, resp. rate 20, height 5\' 6"  (1.676 m), weight 88.451 kg (195 lb). Fundus firm and NT  Disposition: 01-Home or Self Care  Discharge Orders   Future Orders Complete By Expires   Diet - low sodium heart healthy  As directed    Discharge instructions  As directed    Comments:     Nothing in vagina for 3 weeks.  Call with heavy bleeding   Increase activity slowly  As directed        Medication List    STOP taking these medications       acetaminophen 500 MG tablet  Commonly known as:  TYLENOL      TAKE these medications       ibuprofen 600 MG tablet  Commonly known as:  ADVIL,MOTRIN  Take 1 tablet (600 mg total) by mouth every 6 (six) hours.     oxyCODONE-acetaminophen 5-325 MG per tablet  Commonly known as:  PERCOCET/ROXICET  Take 1-2 tablets by mouth every 4 (four) hours as needed.           Follow-up Information   Follow up with Oliver Pila, MD. Schedule an appointment as soon as possible for a visit in 3 weeks. (followup of miscarriage)     Specialty:  Obstetrics and Gynecology   Contact information:   [address], [address] [place] [address] [phone]       Signed: KWANZA, CANCELLIERE 10/08/2012, 8:32 PM

## 2012-10-08 NOTE — H&P (Signed)
Erika Gonzales is a 32 y.o. female [id number] at 17+ weeks gestation (EDD  03/18/13) presented to the office with vaginal bleeding and cramping today.  On speculum exam was seen to have a bulging bag of water with possible fetus in the vagina.  Sent immediately to hospital.  Shortly after arrival, she had SROM and delivered the fetus and placenta intact with the RN.  I arrived shortly thereafter and an exam revealed no heavy bleeding and no obvious tissue felt remaining in vagina or cervix.   Prenatal care to this point had been significant for intermittent vaginal bleeding with reassuring [place].  She has a history of C/S x 2 and had planned a repeat with this pregnancy.  She also had a h/o PROM at 34 weeks and had started her 17-porgesterone at 15 weeks.  Maternal Medical History:  Reason for admission: Contractions.   Contractions: Frequency: regular.   Perceived severity is moderate.    Prenatal Complications - Diabetes: none.    Past Ob Hx C/S 2002 6#2oz C/S 2012 6#  34 weeks   2008 SAB  Past Medical History  Diagnosis Date  . HSV-2 infection   . Fibroid   . Headache(784.0)   . Ovarian cyst   . Abnormal Pap smear     HPV  . Infection     UTI, chlamydia   Past Surgical History  Procedure Laterality Date  . Cesarean section    . Tonsillectomy    . Adnoidectomy    . Myomectomy     Family History: family history includes Diabetes in her father and mother; Heart disease in her father; Hypertension in her mother. Social History:  reports that she has been smoking Cigarettes.  She has a 3 pack-year smoking history. She has never used smokeless tobacco. She reports that  drinks alcohol. She reports that she does not use illicit drugs.   Prenatal Transfer Tool  Maternal Diabetes: No Genetic Screening: Declined Maternal Ultrasounds/Referrals:Early US WNL but had not had full anatomy US yet Fetal Ultrasounds or other Referrals:  None Maternal Substance Abuse:  No Significant  Maternal Medications:  Meds include: Progesterone Significant Maternal Lab Results:  Lab values include: Other: HSV carrier, +chlamydia, treated  Other Comments:  None  ROS    Blood pressure 112/53, pulse 71, temperature 99.1 F (37.3 C), temperature source Oral, resp. rate 20, height 5\' 5"  (1.651 m), weight 88.451 kg (195 lb). Exam Physical Exam  Prenatal labs: ABO, Rh:  O positive Antibody:  negative Rubella:  Immune RPR:   neg HBsAg:   neg HIV:   neg Hgb AA GC negative Chlam positive   Assessment/Plan: Pt s/p 17 week delivery of fetus and placenta, all appears intact.  Will send her to floor to observe for a few hours and then if good allow d/c home.   Oliver Pila 10/08/2012, 5:02 PM

## 2012-10-08 NOTE — Progress Notes (Signed)
Pt discharged home with boyfriend... Condition stable... No equipment... Ambulated to car with Selinda Michaels, RN.

## 2012-10-08 NOTE — Progress Notes (Signed)
Patient ID: Erika Gonzales, female   DOB: May 14, 1980, 32 y.o.   MRN: [id number] Pt feels ok except for some cramping Tolerating regular diet afeb vss Light VB  Request d/c home  Will d/c to f/u in 3 weeks Emotional support given

## 2013-05-05 ENCOUNTER — Ambulatory Visit (INDEPENDENT_AMBULATORY_CARE_PROVIDER_SITE_OTHER): Payer: 59

## 2013-05-05 DIAGNOSIS — Z3490 Encounter for supervision of normal pregnancy, unspecified, unspecified trimester: Secondary | ICD-10-CM

## 2013-05-05 DIAGNOSIS — Z3689 Encounter for other specified antenatal screening: Secondary | ICD-10-CM

## 2013-05-05 DIAGNOSIS — Z3480 Encounter for supervision of other normal pregnancy, unspecified trimester: Secondary | ICD-10-CM

## 2013-05-05 LAB — OB RESULTS CONSOLE PLATELET COUNT: Platelets: 346 10*3/uL

## 2013-05-05 LAB — OB RESULTS CONSOLE RPR: RPR: NONREACTIVE

## 2013-05-05 LAB — OB RESULTS CONSOLE HGB/HCT, BLOOD
HEMATOCRIT: 39 %
Hemoglobin: 13.1 g/dL

## 2013-05-05 LAB — OB RESULTS CONSOLE RUBELLA ANTIBODY, IGM: RUBELLA: IMMUNE

## 2013-05-05 NOTE — Progress Notes (Signed)
Pt here for OB labs.  Pt brought with her + HCG result from [facility].  Pt decided to come here for her prenatal care.  Initial OB labs drawn today along with early glucola due to mom and dad having diabetes.  [place] scheduled for anatomy for July 14th @ 0800.  LMP 03/12/13.

## 2013-05-06 ENCOUNTER — Encounter: Payer: Self-pay | Admitting: *Deleted

## 2013-05-06 LAB — OBSTETRIC PANEL
ANTIBODY SCREEN: NEGATIVE
Basophils Absolute: 0 10*3/uL (ref 0.0–0.1)
Basophils Relative: 0 % (ref 0–1)
Eosinophils Absolute: 0.1 10*3/uL (ref 0.0–0.7)
Eosinophils Relative: 1 % (ref 0–5)
HEMATOCRIT: 39.4 % (ref 36.0–46.0)
HEMOGLOBIN: 13.1 g/dL (ref 12.0–15.0)
HEP B S AG: NEGATIVE
Lymphocytes Relative: 32 % (ref 12–46)
Lymphs Abs: 1.9 10*3/uL (ref 0.7–4.0)
MCH: 27.8 pg (ref 26.0–34.0)
MCHC: 33.2 g/dL (ref 30.0–36.0)
MCV: 83.5 fL (ref 78.0–100.0)
MONO ABS: 0.5 10*3/uL (ref 0.1–1.0)
MONOS PCT: 9 % (ref 3–12)
NEUTROS ABS: 3.4 10*3/uL (ref 1.7–7.7)
Neutrophils Relative %: 58 % (ref 43–77)
Platelets: 346 10*3/uL (ref 150–400)
RBC: 4.72 MIL/uL (ref 3.87–5.11)
RDW: 13.6 % (ref 11.5–15.5)
Rh Type: POSITIVE
Rubella: 2.63 Index — ABNORMAL HIGH (ref <0.90)
WBC: 5.8 10*3/uL (ref 4.0–10.5)

## 2013-05-06 LAB — HIV ANTIBODY (ROUTINE TESTING W REFLEX): HIV 1&2 Ab, 4th Generation: NONREACTIVE

## 2013-05-06 LAB — GLUCOSE TOLERANCE, 1 HOUR (50G) W/O FASTING: Glucose, 1 Hour GTT: 67 mg/dL — ABNORMAL LOW (ref 70–140)

## 2013-05-07 LAB — HEMOGLOBINOPATHY EVALUATION
HEMOGLOBIN OTHER: 0 %
Hgb A2 Quant: 2.5 % (ref 2.2–3.2)
Hgb A: 97.5 % (ref 96.8–97.8)
Hgb F Quant: 0 % (ref 0.0–2.0)
Hgb S Quant: 0 %

## 2013-05-27 ENCOUNTER — Other Ambulatory Visit: Payer: Self-pay | Admitting: Advanced Practice Midwife

## 2013-05-27 ENCOUNTER — Ambulatory Visit (INDEPENDENT_AMBULATORY_CARE_PROVIDER_SITE_OTHER): Payer: 59 | Admitting: Advanced Practice Midwife

## 2013-05-27 ENCOUNTER — Encounter: Payer: Self-pay | Admitting: Advanced Practice Midwife

## 2013-05-27 ENCOUNTER — Other Ambulatory Visit: Payer: Self-pay | Admitting: Obstetrics & Gynecology

## 2013-05-27 VITALS — BP 113/71 | HR 86 | Temp 98.2°F | Wt 189.4 lb

## 2013-05-27 DIAGNOSIS — O24919 Unspecified diabetes mellitus in pregnancy, unspecified trimester: Secondary | ICD-10-CM

## 2013-05-27 DIAGNOSIS — F17200 Nicotine dependence, unspecified, uncomplicated: Secondary | ICD-10-CM

## 2013-05-27 DIAGNOSIS — Z8742 Personal history of other diseases of the female genital tract: Secondary | ICD-10-CM

## 2013-05-27 DIAGNOSIS — Z8619 Personal history of other infectious and parasitic diseases: Secondary | ICD-10-CM

## 2013-05-27 DIAGNOSIS — O09299 Supervision of pregnancy with other poor reproductive or obstetric history, unspecified trimester: Secondary | ICD-10-CM

## 2013-05-27 DIAGNOSIS — O09291 Supervision of pregnancy with other poor reproductive or obstetric history, first trimester: Secondary | ICD-10-CM | POA: Insufficient documentation

## 2013-05-27 DIAGNOSIS — O09899 Supervision of other high risk pregnancies, unspecified trimester: Secondary | ICD-10-CM

## 2013-05-27 DIAGNOSIS — G43909 Migraine, unspecified, not intractable, without status migrainosus: Secondary | ICD-10-CM

## 2013-05-27 DIAGNOSIS — B009 Herpesviral infection, unspecified: Secondary | ICD-10-CM

## 2013-05-27 DIAGNOSIS — O34219 Maternal care for unspecified type scar from previous cesarean delivery: Secondary | ICD-10-CM

## 2013-05-27 DIAGNOSIS — Z8759 Personal history of other complications of pregnancy, childbirth and the puerperium: Secondary | ICD-10-CM

## 2013-05-27 LAB — POCT URINALYSIS DIP (DEVICE)
Bilirubin Urine: NEGATIVE
Glucose, UA: NEGATIVE mg/dL
HGB URINE DIPSTICK: NEGATIVE
Ketones, ur: NEGATIVE mg/dL
Leukocytes, UA: NEGATIVE
Nitrite: NEGATIVE
PH: 6.5 (ref 5.0–8.0)
PROTEIN: NEGATIVE mg/dL
Specific Gravity, Urine: 1.025 (ref 1.005–1.030)
UROBILINOGEN UA: 0.2 mg/dL (ref 0.0–1.0)

## 2013-05-27 MED ORDER — BUTALBITAL-APAP-CAFFEINE 50-325-40 MG PO CAPS: 1.0000 | ORAL_CAPSULE | Freq: Four times a day (QID) | ORAL | Status: DC | PRN

## 2013-05-27 NOTE — Progress Notes (Signed)
GC Ch added to urine

## 2013-05-27 NOTE — Progress Notes (Signed)
C/o of headaches causing blurred vision in right eye; states headaches last most of the day but blurriness lasts 10-20 min.  Occasional edema in hands and feet.  OB labs already done. Pt. Reports having pap in July 2014- will request results.  Discussed appropriate weight gain (11-20lb); pt. Verbalized understanding.  New OB packet given.

## 2013-05-27 NOTE — Patient Instructions (Signed)
Pregnancy - First Trimester  During sexual intercourse, millions of sperm go into the vagina. Only 1 sperm will penetrate and fertilize the female egg while it is in the Fallopian tube. One week later, the fertilized egg implants into the wall of the uterus. An embryo begins to develop into a baby. At 6 to 8 weeks, the eyes and face are formed and the heartbeat can be seen on ultrasound. At the end of 12 weeks (first trimester), all the baby's organs are formed. Now that you are pregnant, you will want to do everything you can to have a healthy baby. Two of the most important things are to get good prenatal care and follow your caregiver's instructions. Prenatal care is all the medical care you receive before the baby's birth. It is given to prevent, find, and treat problems during the pregnancy and childbirth.  PRENATAL EXAMS  · During prenatal visits, your weight, blood pressure, and urine are checked. This is done to make sure you are healthy and progressing normally during the pregnancy.  · A pregnant woman should gain 25 to 35 pounds during the pregnancy. However, if you are overweight or underweight, your caregiver will advise you regarding your weight.  · Your caregiver will ask and answer questions for you.  · Blood work, cervical cultures, other necessary tests, and a Pap test are done during your prenatal exams. These tests are done to check on your health and the probable health of your baby. Tests are strongly recommended and done for HIV with your permission. This is the virus that causes AIDS. These tests are done because medicines can be given to help prevent your baby from being born with this infection should you have been infected without knowing it. Blood work is also used to find out your blood type, previous infections, and follow your blood levels (hemoglobin).  · Low hemoglobin (anemia) is common during pregnancy. Iron and vitamins are given to help prevent this. Later in the pregnancy, blood  tests for diabetes will be done along with any other tests if any problems develop.  · You may need other tests to make sure you and the baby are doing well.  CHANGES DURING THE FIRST TRIMESTER   Your body goes through many changes during pregnancy. They vary from person to person. Talk to your caregiver about changes you notice and are concerned about. Changes can include:  · Your menstrual period stops.  · The egg and sperm carry the genes that determine what you look like. Genes from you and your partner are forming a baby. The female genes determine whether the baby is a boy or a girl.  · Your body increases in girth and you may feel bloated.  · Feeling sick to your stomach (nauseous) and throwing up (vomiting). If the vomiting is uncontrollable, call your caregiver.  · Your breasts will begin to enlarge and become tender.  · Your nipples may stick out more and become darker.  · The need to urinate more. Painful urination may mean you have a bladder infection.  · Tiring easily.  · Loss of appetite.  · Cravings for certain kinds of food.  · At first, you may gain or lose a couple of pounds.  · You may have changes in your emotions from day to day (excited to be pregnant or concerned something may go wrong with the pregnancy and baby).  · You may have more vivid and strange dreams.  HOME CARE INSTRUCTIONS   ·   It is very important to avoid all smoking, alcohol and non-prescribed drugs during your pregnancy. These affect the formation and growth of the baby. Avoid chemicals while pregnant to ensure the delivery of a healthy infant.  · Start your prenatal visits by the 12th week of pregnancy. They are usually scheduled monthly at first, then more often in the last 2 months before delivery. Keep your caregiver's appointments. Follow your caregiver's instructions regarding medicine use, blood and lab tests, exercise, and diet.  · During pregnancy, you are providing food for you and your baby. Eat regular, well-balanced  meals. Choose foods such as meat, fish, milk and other low fat dairy products, vegetables, fruits, and whole-grain breads and cereals. Your caregiver will tell you of the ideal weight gain.  · You can help morning sickness by keeping soda crackers at the bedside. Eat a couple before arising in the morning. You may want to use the crackers without salt on them.  · Eating 4 to 5 small meals rather than 3 large meals a day also may help the nausea and vomiting.  · Drinking liquids between meals instead of during meals also seems to help nausea and vomiting.  · A physical sexual relationship may be continued throughout pregnancy if there are no other problems. Problems may be early (premature) leaking of amniotic fluid from the membranes, vaginal bleeding, or belly (abdominal) pain.  · Exercise regularly if there are no restrictions. Check with your caregiver or physical therapist if you are unsure of the safety of some of your exercises. Greater weight gain will occur in the last 2 trimesters of pregnancy. Exercising will help:  · Control your weight.  · Keep you in shape.  · Prepare you for labor and delivery.  · Help you lose your pregnancy weight after you deliver your baby.  · Wear a good support or jogging bra for breast tenderness during pregnancy. This may help if worn during sleep too.  · Ask when prenatal classes are available. Begin classes when they are offered.  · Do not use hot tubs, steam rooms, or saunas.  · Wear your seat belt when driving. This protects you and your baby if you are in an accident.  · Avoid raw meat, uncooked cheese, cat litter boxes, and soil used by cats throughout the pregnancy. These carry germs that can cause birth defects in the baby.  · The first trimester is a good time to visit your dentist for your dental health. Getting your teeth cleaned is okay. Use a softer toothbrush and brush gently during pregnancy.  · Ask for help if you have financial, counseling, or nutritional needs  during pregnancy. Your caregiver will be able to offer counseling for these needs as well as refer you for other special needs.  · Do not take any medicines or herbs unless told by your caregiver.  · Inform your caregiver if there is any mental or physical domestic violence.  · Make a list of emergency phone numbers of family, friends, hospital, and police and fire departments.  · Write down your questions. Take them to your prenatal visit.  · Do not douche.  · Do not cross your legs.  · If you have to stand for long periods of time, rotate you feet or take small steps in a circle.  · You may have more vaginal secretions that may require a sanitary pad. Do not use tampons or scented sanitary pads.  MEDICINES AND DRUG USE IN PREGNANCY  ·   Take prenatal vitamins as directed. The vitamin should contain 1 milligram of folic acid. Keep all vitamins out of reach of children. Only a couple vitamins or tablets containing iron may be fatal to a baby or young child when ingested.  · Avoid use of all medicines, including herbs, over-the-counter medicines, not prescribed or suggested by your caregiver. Only take over-the-counter or prescription medicines for pain, discomfort, or fever as directed by your caregiver. Do not use aspirin, ibuprofen, or naproxen unless directed by your caregiver.  · Let your caregiver also know about herbs you may be using.  · Alcohol is related to a number of birth defects. This includes fetal alcohol syndrome. All alcohol, in any form, should be avoided completely. Smoking will cause low birth rate and premature babies.  · Street or illegal drugs are very harmful to the baby. They are absolutely forbidden. A baby born to an addicted mother will be addicted at birth. The baby will go through the same withdrawal an adult does.  · Let your caregiver know about any medicines that you have to take and for what reason you take them.  SEEK MEDICAL CARE IF:   You have any concerns or worries during your  pregnancy. It is better to call with your questions if you feel they cannot wait, rather than worry about them.  SEEK IMMEDIATE MEDICAL CARE IF:   · An unexplained oral temperature above 102° F (38.9° C) develops, or as your caregiver suggests.  · You have leaking of fluid from the vagina (birth canal). If leaking membranes are suspected, take your temperature and inform your caregiver of this when you call.  · There is vaginal spotting or bleeding. Notify your caregiver of the amount and how many pads are used.  · You develop a bad smelling vaginal discharge with a change in the color.  · You continue to feel sick to your stomach (nauseated) and have no relief from remedies suggested. You vomit blood or coffee ground-like materials.  · You lose more than 2 pounds of weight in 1 week.  · You gain more than 2 pounds of weight in 1 week and you notice swelling of your face, hands, feet, or legs.  · You gain 5 pounds or more in 1 week (even if you do not have swelling of your hands, face, legs, or feet).  · You get exposed to German measles and have never had them.  · You are exposed to fifth disease or chickenpox.  · You develop belly (abdominal) pain. Round ligament discomfort is a common non-cancerous (benign) cause of abdominal pain in pregnancy. Your caregiver still must evaluate this.  · You develop headache, fever, diarrhea, pain with urination, or shortness of breath.  · You fall or are in a car accident or have any kind of trauma.  · There is mental or physical violence in your home.  Document Released: 12/26/2000 Document Revised: 09/26/2011 Document Reviewed: 06/29/2008  [employer]® Patient Information ©2014 [employer].

## 2013-05-27 NOTE — Progress Notes (Signed)
New OB. See Smart Set  Subjective:    Erika Gonzales is a [id number] [redacted]w[redacted]d being seen today for her first obstetrical visit.  Her obstetrical history is significant for History of HSV, Hx 15 week loss ? incompetent cervix.  States did have some cramping right before she was found in office to have bulging membranes with fetus in vagina.  Was not told she needed cerclage this time.  Will consult with MFM.  Did have C/S x 2 pregnancies prior to this 15 week delivery.   Patient does intend to breast feed. Pregnancy history fully reviewed.  Patient reports no complaints.  Filed Vitals:   05/27/13 0959  BP: 113/71  Pulse: 86  Temp: 98.2 F (36.8 C)  Weight: 189 lb 6.4 oz (85.911 kg)    HISTORY: OB History  Gravida Para Term Preterm AB SAB TAB Ectopic Multiple Living  5 2 1 1 2 2  0 0 0 2    # Outcome Date GA Lbr Len/2nd Weight Sex Delivery Anes PTL Lv  5 CUR           4 PRE 11/09/10 [redacted]w[redacted]d  7 lb 7 oz (3.374 kg) M CS   Y     Comments: elevated BP   3 TRM 06/07/00 [redacted]w[redacted]d  6 lb (2.722 kg) F LTCS  N Y     Comments: distress; emergency c-section  2 SAB           1 SAB              Past Medical History  Diagnosis Date  . HSV-2 infection   . Fibroid   . Headache(784.0)   . Ovarian cyst   . Abnormal Pap smear     HPV  . Infection     UTI, chlamydia   Past Surgical History  Procedure Laterality Date  . Cesarean section    . Tonsillectomy    . Adnoidectomy    . Myomectomy     Family History  Problem Relation Age of Onset  . Hypertension Mother   . Diabetes Mother   . Diabetes Father   . Heart disease Father      Exam    Uterus:  Fundal Height: 10 cm  Pelvic Exam:    Perineum: No Hemorrhoids, Normal Perineum   Vulva: Bartholin's, Urethra, Skene's normal   Vagina:  normal mucosa, normal discharge   pH:    Cervix: multiparous appearance   Adnexa: no mass, fullness, tenderness   Bony Pelvis: gynecoid  System: Breast:  normal appearance, no masses or tenderness    Skin: normal coloration and turgor, no rashes    Neurologic: oriented, grossly non-focal   Extremities: normal strength, tone, and muscle mass   HEENT neck supple with midline trachea   Mouth/Teeth mucous membranes moist, pharynx normal without lesions   Neck supple and no masses   Cardiovascular: regular rate and rhythm   Respiratory:  appears well, vitals normal, no respiratory distress, acyanotic, normal RR, ear and throat exam is normal, neck free of mass or lymphadenopathy, chest clear, no wheezing, crepitations, rhonchi, normal symmetric air entry   Abdomen: soft, non-tender; bowel sounds normal; no masses,  no organomegaly   Urinary: urethral meatus normal      Assessment:    Pregnancy: [id number] Patient Active Problem List   Diagnosis Date Noted  . Smoker 05/27/2013  . HSV-2 (herpes simplex virus 2) infection 05/27/2013  . History of abnormal cervical Pap smear 05/27/2013  . History of maternal  chlamydia infection, currently pregnant 05/27/2013  . Previous cesarean delivery affecting pregnancy, antepartum 05/27/2013  . History of incompetent cervix, currently pregnant in first trimester 05/27/2013        Plan:     Initial labs drawn. Prenatal vitamins. Problem list reviewed and updated.  Genetic Screening discussed First Screen: ordered.  Ultrasound discussed; fetal survey: ordered.  Will have MFM do Cervical Length as well and will ask them to consult and advise on need for cerclage or 17P.   Follow up in 4 weeks. 50% of 30 min visit spent on counseling and coordination of care.     Seabron Spates 05/27/2013

## 2013-05-28 ENCOUNTER — Telehealth: Payer: Self-pay

## 2013-05-28 LAB — GC/CHLAMYDIA PROBE AMP
CT Probe RNA: NEGATIVE
GC Probe RNA: NEGATIVE

## 2013-05-28 NOTE — Telephone Encounter (Signed)
Pt. Called stating she had gone to her pharmacy to get her fioricet however they informed her the medication prescription had not been sent; it was printed here in clinic. Informed pt. Medication would be called in to her preferred pharmacy [facility] on [address]. Pt. Verbalized understanding and gratitude. NO questions or concerns.

## 2013-05-29 LAB — PRESCRIPTION MONITORING PROFILE (19 PANEL)
AMPHETAMINE/METH: NEGATIVE ng/mL
Barbiturate Screen, Urine: NEGATIVE ng/mL
Benzodiazepine Screen, Urine: NEGATIVE ng/mL
Buprenorphine, Urine: NEGATIVE ng/mL
CANNABINOID SCRN UR: NEGATIVE ng/mL
CARISOPRODOL, URINE: NEGATIVE ng/mL
COCAINE METABOLITES: NEGATIVE ng/mL
CREATININE, URINE: 226.27 mg/dL (ref >20.0)
ECSTASY: NEGATIVE ng/mL
FENTANYL URINE: NEGATIVE ng/mL
MEPERIDINE UR: NEGATIVE ng/mL
METHADONE SCREEN, URINE: NEGATIVE ng/mL
METHAQUALONE SCREEN (URINE): NEGATIVE ng/mL
Nitrites, Initial: NEGATIVE ug/mL
Opiate Screen, Urine: NEGATIVE ng/mL
Oxycodone Screen, Ur: NEGATIVE ng/mL
PHENCYCLIDINE, UR: NEGATIVE ng/mL
Propoxyphene: NEGATIVE ng/mL
TAPENTADOLUR: NEGATIVE ng/mL
Tramadol Scrn, Ur: NEGATIVE ng/mL
Zolpidem, Urine: NEGATIVE ng/mL
pH, Initial: 7.1 pH (ref 4.5–8.9)

## 2013-05-29 LAB — GC/CHLAMYDIA PROBE AMP
CT Probe RNA: NEGATIVE
GC Probe RNA: NEGATIVE

## 2013-05-30 LAB — CULTURE, OB URINE: Colony Count: >=100000

## 2013-06-03 ENCOUNTER — Other Ambulatory Visit: Payer: Self-pay | Admitting: Advanced Practice Midwife

## 2013-06-03 MED ORDER — NITROFURANTOIN MONOHYD MACRO 100 MG PO CAPS: 100.0000 mg | ORAL_CAPSULE | Freq: Two times a day (BID) | ORAL | Status: DC

## 2013-06-04 ENCOUNTER — Telehealth: Payer: Self-pay

## 2013-06-04 MED ORDER — NITROFURANTOIN MONOHYD MACRO 100 MG PO CAPS: 100.0000 mg | ORAL_CAPSULE | Freq: Two times a day (BID) | ORAL | Status: DC

## 2013-06-04 NOTE — Telephone Encounter (Signed)
Message copied by Michel Harrow on Thu Jun 04, 2013  9:08 AM ------      Message from: Seabron Spates      Created: Wed Jun 03, 2013  4:50 PM      Regarding: UTI tx       Enterococcus            I put in Macrobid x 7 d,but would not eprescribe            Can you call her?            Thanks      Lelan Pons ------

## 2013-06-04 NOTE — Telephone Encounter (Signed)
Also need to ask patient when she wants FMLA to start. (during pregnancy or for after delivery)

## 2013-06-04 NOTE — Telephone Encounter (Signed)
Called pt and left message that she has a UTI and that an Rx for antibiotics have been sent to her [facility] off [address]. To please take as prescribed and if she has any questions to please call the clinics.

## 2013-06-11 ENCOUNTER — Encounter (HOSPITAL_COMMUNITY): Payer: Self-pay

## 2013-06-11 ENCOUNTER — Ambulatory Visit (HOSPITAL_COMMUNITY)
Admission: RE | Admit: 2013-06-11 | Discharge: 2013-06-11 | Disposition: A | Discharge status: Home / Self Care | Payer: 59 | Source: Ambulatory Visit | Attending: Advanced Practice Midwife | Admitting: Advanced Practice Midwife

## 2013-06-11 DIAGNOSIS — E119 Type 2 diabetes mellitus without complications: Secondary | ICD-10-CM | POA: Insufficient documentation

## 2013-06-11 DIAGNOSIS — O24919 Unspecified diabetes mellitus in pregnancy, unspecified trimester: Secondary | ICD-10-CM

## 2013-06-11 DIAGNOSIS — O09299 Supervision of pregnancy with other poor reproductive or obstetric history, unspecified trimester: Secondary | ICD-10-CM

## 2013-06-12 ENCOUNTER — Encounter: Payer: Self-pay | Admitting: Obstetrics & Gynecology

## 2013-06-12 DIAGNOSIS — Z3480 Encounter for supervision of other normal pregnancy, unspecified trimester: Secondary | ICD-10-CM | POA: Insufficient documentation

## 2013-06-18 ENCOUNTER — Other Ambulatory Visit: Payer: Self-pay

## 2013-06-23 ENCOUNTER — Telehealth: Payer: Self-pay

## 2013-06-23 ENCOUNTER — Telehealth (HOSPITAL_COMMUNITY): Payer: Self-pay | Admitting: MS"

## 2013-06-23 DIAGNOSIS — O280 Abnormal hematological finding on antenatal screening of mother: Secondary | ICD-10-CM

## 2013-06-23 NOTE — Telephone Encounter (Signed)
Pt First Trimester Screen resulted at risk for Down Syndrome 1:232. Pt has scheduled genetic counseling with [place] on 07/23/13 @ 0900.  Called pt to verify that she is aware of her abnormal results and her appt scheduled with MFM on 07/23/13 @ 0900 and had to leave a message requesting a return call back.

## 2013-06-23 NOTE — Telephone Encounter (Signed)
Attempted to call patient regarding first trimester screen result. Left message for patient to return call.   Erika Gonzales [name] 06/23/2013 10:36 AM

## 2013-06-23 NOTE — Telephone Encounter (Signed)
Called [name] to review results of first trimester screen, which are screen positive for Down syndrome. Patient identified by name and DOB. Reviewed the screen adjusted risk for fetal Down syndrome is 1 in 232 (0.4%). Discussed with the patient that this is not diagnostic of Down syndrome in the pregnancy. Introduced that there are additional testing and screening options that can be offered if the patient wants more information. Patient stated, "No. I'm fine." regarding additional screening options. Did not go into detail regarding the additional screens/tests. Reviewed that detailed ultrasound is scheduled in the second trimester in main radiology, and given the first trimester screen result will plan to schedule in [facility] along with genetic counseling, which she may or may not desire at that time. Discussed that targeted ultrasound is a screening tool for Down syndrome but cannot diagnose or rule out this condition. Encouraged patient to call back with questions or concerns and/or if she would like to come in sooner for genetic counseling. Also reviewed that first trimester screen decreased the risk for fetal trisomy 18/13 (1 in 1.060 to 1 in 4,991). Patient stated that she is comfortable with this risk assessment at this time.   Erika Gonzales  06/23/2013 10:57 AM

## 2013-06-24 ENCOUNTER — Encounter: Payer: Self-pay | Admitting: Obstetrics and Gynecology

## 2013-06-24 ENCOUNTER — Other Ambulatory Visit: Payer: Self-pay | Admitting: Obstetrics and Gynecology

## 2013-06-24 ENCOUNTER — Ambulatory Visit (INDEPENDENT_AMBULATORY_CARE_PROVIDER_SITE_OTHER): Payer: 59 | Admitting: Obstetrics and Gynecology

## 2013-06-24 VITALS — BP 105/71 | HR 78 | Wt 189.7 lb

## 2013-06-24 DIAGNOSIS — O09899 Supervision of other high risk pregnancies, unspecified trimester: Secondary | ICD-10-CM | POA: Insufficient documentation

## 2013-06-24 DIAGNOSIS — O09219 Supervision of pregnancy with history of pre-term labor, unspecified trimester: Secondary | ICD-10-CM

## 2013-06-24 DIAGNOSIS — Z8742 Personal history of other diseases of the female genital tract: Secondary | ICD-10-CM

## 2013-06-24 LAB — POCT URINALYSIS DIP (DEVICE)
GLUCOSE, UA: NEGATIVE mg/dL
HGB URINE DIPSTICK: NEGATIVE
Leukocytes, UA: NEGATIVE
Nitrite: NEGATIVE
Protein, ur: 30 mg/dL — AB
Urobilinogen, UA: 0.2 mg/dL (ref 0.0–1.0)
pH: 6 (ref 5.0–8.0)

## 2013-06-24 NOTE — Telephone Encounter (Signed)
Patient called back to front office and I informed her of results and appt with MFM on 7/9. Patient verbalized understanding to all and had no questions

## 2013-06-24 NOTE — Progress Notes (Signed)
Fundus at [id number]. Had NT> do MSAFP.today Reviewed OB hx. P2  [redacted]w[redacted]d SROM and transferred to [facility], C/S about a week later. States BW was over 7#>ROI records. Subsequently had SOOL at 15 wks. 17-p offered and explalined. Brochure given. Pt is undecided and will call us before next visit if she does decide to take 17-p. Anatomy scan scheduled.

## 2013-06-24 NOTE — Telephone Encounter (Signed)
Called patient, no answer- left message that we are trying to reach you, please give [place] a call back.

## 2013-06-24 NOTE — Patient Instructions (Addendum)
Second Trimester of Pregnancy The second trimester is from week 13 through week 28, months 4 through 6. The second trimester is often a time when you feel your best. Your body has also adjusted to being pregnant, and you begin to feel better physically. Usually, morning sickness has lessened or quit completely, you may have more energy, and you may have an increase in appetite. The second trimester is also a time when the fetus is growing rapidly. At the end of the sixth month, the fetus is about 9 inches long and weighs about 1 pounds. You will likely begin to feel the baby move (quickening) between 18 and 20 weeks of the pregnancy. BODY CHANGES Your body goes through many changes during pregnancy. The changes vary from woman to woman.   Your weight will continue to increase. You will notice your lower abdomen bulging out.  You may begin to get stretch marks on your hips, abdomen, and breasts.  You may develop headaches that can be relieved by medicines approved by your caregiver.  You may urinate more often because the fetus is pressing on your bladder.  You may develop or continue to have heartburn as a result of your pregnancy.  You may develop constipation because certain hormones are causing the muscles that push waste through your intestines to slow down.  You may develop hemorrhoids or swollen, bulging veins (varicose veins).  You may have back pain because of the weight gain and pregnancy hormones relaxing your joints between the bones in your pelvis and as a result of a shift in weight and the muscles that support your balance.  Your breasts will continue to grow and be tender.  Your gums may bleed and may be sensitive to brushing and flossing.  Dark spots or blotches (chloasma, mask of pregnancy) may develop on your face. This will likely fade after the baby is born.  A dark line from your belly button to the pubic area (linea nigra) may appear. This will likely fade after the  baby is born. WHAT TO EXPECT AT YOUR PRENATAL VISITS During a routine prenatal visit:  You will be weighed to make sure you and the fetus are growing normally.  Your blood pressure will be taken.  Your abdomen will be measured to track your baby's growth.  The fetal heartbeat will be listened to.  Any test results from the previous visit will be discussed. Your caregiver may ask you:  How you are feeling.  If you are feeling the baby move.  If you have had any abnormal symptoms, such as leaking fluid, bleeding, severe headaches, or abdominal cramping.  If you have any questions. Other tests that may be performed during your second trimester include:  Blood tests that check for:  Low iron levels (anemia).  Gestational diabetes (between 24 and 28 weeks).  Rh antibodies.  Urine tests to check for infections, diabetes, or protein in the urine.  An ultrasound to confirm the proper growth and development of the baby.  An amniocentesis to check for possible genetic problems.  Fetal screens for spina bifida and Down syndrome. HOME CARE INSTRUCTIONS   Avoid all smoking, herbs, alcohol, and unprescribed drugs. These chemicals affect the formation and growth of the baby.  Follow your caregiver's instructions regarding medicine use. There are medicines that are either safe or unsafe to take during pregnancy.  Exercise only as directed by your caregiver. Experiencing uterine cramps is a good sign to stop exercising.  Continue to eat regular,   healthy meals.  Wear a good support bra for breast tenderness.  Do not use hot tubs, steam rooms, or saunas.  Wear your seat belt at all times when driving.  Avoid raw meat, uncooked cheese, cat litter boxes, and soil used by cats. These carry germs that can cause birth defects in the baby.  Take your prenatal vitamins.  Try taking a stool softener (if your caregiver approves) if you develop constipation. Eat more high-fiber foods,  such as fresh vegetables or fruit and whole grains. Drink plenty of fluids to keep your urine clear or pale yellow.  Take warm sitz baths to soothe any pain or discomfort caused by hemorrhoids. Use hemorrhoid cream if your caregiver approves.  If you develop varicose veins, wear support hose. Elevate your feet for 15 minutes, 3 4 times a day. Limit salt in your diet.  Avoid heavy lifting, wear low heel shoes, and practice good posture.  Rest with your legs elevated if you have leg cramps or low back pain.  Visit your dentist if you have not gone yet during your pregnancy. Use a soft toothbrush to brush your teeth and be gentle when you floss.  A sexual relationship may be continued unless your caregiver directs you otherwise.  Continue to go to all your prenatal visits as directed by your caregiver. SEEK MEDICAL CARE IF:   You have dizziness.  You have mild pelvic cramps, pelvic pressure, or nagging pain in the abdominal area.  You have persistent nausea, vomiting, or diarrhea.  You have a bad smelling vaginal discharge.  You have pain with urination. SEEK IMMEDIATE MEDICAL CARE IF:   You have a fever.  You are leaking fluid from your vagina.  You have spotting or bleeding from your vagina.  You have severe abdominal cramping or pain.  You have rapid weight gain or loss.  You have shortness of breath with chest pain.  You notice sudden or extreme swelling of your face, hands, ankles, feet, or legs.  You have not felt your baby move in over an hour.  You have severe headaches that do not go away with medicine.  You have vision changes. Document Released: 12/26/2000 Document Revised: 09/03/2012 Document Reviewed: 03/04/2012 [employer] Patient Information 2014 [employer].  

## 2013-06-25 LAB — ALPHA FETOPROTEIN, MATERNAL
AFP: 36.6 IU/mL
Curr Gest Age: 14.6 wks.days
OPEN SPINA BIFIDA: NEGATIVE

## 2013-06-26 ENCOUNTER — Encounter (HOSPITAL_COMMUNITY): Payer: 59

## 2013-07-01 ENCOUNTER — Encounter: Payer: Self-pay | Admitting: *Deleted

## 2013-07-22 ENCOUNTER — Encounter: Payer: 59 | Admitting: Advanced Practice Midwife

## 2013-07-23 ENCOUNTER — Other Ambulatory Visit: Payer: Self-pay | Admitting: Obstetrics & Gynecology

## 2013-07-23 ENCOUNTER — Ambulatory Visit (HOSPITAL_COMMUNITY)
Admission: RE | Admit: 2013-07-23 | Discharge: 2013-07-23 | Disposition: A | Discharge status: Home / Self Care | Payer: 59 | Source: Ambulatory Visit | Attending: Obstetrics & Gynecology | Admitting: Obstetrics & Gynecology

## 2013-07-23 ENCOUNTER — Ambulatory Visit (HOSPITAL_COMMUNITY): Admission: RE | Admit: 2013-07-23 | Payer: 59 | Source: Ambulatory Visit

## 2013-07-23 ENCOUNTER — Encounter (HOSPITAL_COMMUNITY): Payer: Self-pay

## 2013-07-23 DIAGNOSIS — Z3689 Encounter for other specified antenatal screening: Secondary | ICD-10-CM

## 2013-07-28 ENCOUNTER — Ambulatory Visit (INDEPENDENT_AMBULATORY_CARE_PROVIDER_SITE_OTHER): Payer: 59 | Admitting: Advanced Practice Midwife

## 2013-07-28 ENCOUNTER — Ambulatory Visit (HOSPITAL_COMMUNITY): Payer: 59

## 2013-07-28 VITALS — BP 118/66 | HR 92 | Wt 197.4 lb

## 2013-07-28 DIAGNOSIS — O26892 Other specified pregnancy related conditions, second trimester: Secondary | ICD-10-CM

## 2013-07-28 DIAGNOSIS — O09292 Supervision of pregnancy with other poor reproductive or obstetric history, second trimester: Secondary | ICD-10-CM

## 2013-07-28 DIAGNOSIS — O9989 Other specified diseases and conditions complicating pregnancy, childbirth and the puerperium: Secondary | ICD-10-CM

## 2013-07-28 DIAGNOSIS — R51 Headache: Secondary | ICD-10-CM

## 2013-07-28 DIAGNOSIS — O09299 Supervision of pregnancy with other poor reproductive or obstetric history, unspecified trimester: Secondary | ICD-10-CM

## 2013-07-28 LAB — POCT URINALYSIS DIP (DEVICE)
Bilirubin Urine: NEGATIVE
Glucose, UA: NEGATIVE mg/dL
HGB URINE DIPSTICK: NEGATIVE
Ketones, ur: NEGATIVE mg/dL
Leukocytes, UA: NEGATIVE
Nitrite: NEGATIVE
Protein, ur: NEGATIVE mg/dL
Specific Gravity, Urine: 1.02 (ref 1.005–1.030)
UROBILINOGEN UA: 0.2 mg/dL (ref 0.0–1.0)
pH: 7 (ref 5.0–8.0)

## 2013-07-28 MED ORDER — CYCLOBENZAPRINE HCL 10 MG PO TABS: 10.0000 mg | ORAL_TABLET | Freq: Three times a day (TID) | ORAL | Status: DC | PRN

## 2013-07-28 NOTE — Progress Notes (Signed)
Doing well.  Good fetal movement, denies vaginal bleeding, LOF, regular contractions.  Discussed hx of cervical incompetence in previous pregnancy.  Cervix 3.7 cm on 7/9.  Transvag U/S next week to reevaluate.  Pt offered 17-P at previous visit for hx preterm birth, declined. Pt having headaches often that are one sided, she is light sensitive with them.  Flexeril 10 mg TID PRN sent to pharmacy, also try Bendadryl PRN. Increase PO fluids.

## 2013-07-28 NOTE — Progress Notes (Signed)
Scheduled [place] for 08/05/13 @ 0915

## 2013-07-28 NOTE — Progress Notes (Signed)
Occasional edema in feet.  Plans to bottle feed.

## 2013-08-05 ENCOUNTER — Ambulatory Visit (HOSPITAL_COMMUNITY)
Admission: RE | Admit: 2013-08-05 | Discharge: 2013-08-05 | Disposition: A | Discharge status: Home / Self Care | Payer: 59 | Source: Ambulatory Visit | Attending: Advanced Practice Midwife | Admitting: Advanced Practice Midwife

## 2013-08-05 ENCOUNTER — Other Ambulatory Visit: Payer: Self-pay | Admitting: Advanced Practice Midwife

## 2013-08-05 DIAGNOSIS — O09292 Supervision of pregnancy with other poor reproductive or obstetric history, second trimester: Secondary | ICD-10-CM

## 2013-08-05 DIAGNOSIS — Z3689 Encounter for other specified antenatal screening: Secondary | ICD-10-CM | POA: Diagnosis not present

## 2013-08-05 DIAGNOSIS — O09299 Supervision of pregnancy with other poor reproductive or obstetric history, unspecified trimester: Secondary | ICD-10-CM | POA: Diagnosis not present

## 2013-08-25 ENCOUNTER — Ambulatory Visit (INDEPENDENT_AMBULATORY_CARE_PROVIDER_SITE_OTHER): Payer: 59 | Admitting: Advanced Practice Midwife

## 2013-08-25 VITALS — BP 118/71 | HR 95 | Wt 201.2 lb

## 2013-08-25 DIAGNOSIS — Z3480 Encounter for supervision of other normal pregnancy, unspecified trimester: Secondary | ICD-10-CM

## 2013-08-25 DIAGNOSIS — Z3482 Encounter for supervision of other normal pregnancy, second trimester: Secondary | ICD-10-CM

## 2013-08-25 LAB — POCT URINALYSIS DIP (DEVICE)
BILIRUBIN URINE: NEGATIVE
GLUCOSE, UA: NEGATIVE mg/dL
Hgb urine dipstick: NEGATIVE
Ketones, ur: NEGATIVE mg/dL
Leukocytes, UA: NEGATIVE
Nitrite: NEGATIVE
Protein, ur: NEGATIVE mg/dL
SPECIFIC GRAVITY, URINE: 1.02 (ref 1.005–1.030)
UROBILINOGEN UA: 0.2 mg/dL (ref 0.0–1.0)
pH: 6 (ref 5.0–8.0)

## 2013-08-25 NOTE — Patient Instructions (Signed)
Second Trimester of Pregnancy The second trimester is from week 13 through week 28, months 4 through 6. The second trimester is often a time when you feel your best. Your body has also adjusted to being pregnant, and you begin to feel better physically. Usually, morning sickness has lessened or quit completely, you may have more energy, and you may have an increase in appetite. The second trimester is also a time when the fetus is growing rapidly. At the end of the sixth month, the fetus is about 9 inches long and weighs about 1 pounds. You will likely begin to feel the baby move (quickening) between 18 and 20 weeks of the pregnancy. BODY CHANGES Your body goes through many changes during pregnancy. The changes vary from woman to woman.   Your weight will continue to increase. You will notice your lower abdomen bulging out.  You may begin to get stretch marks on your hips, abdomen, and breasts.  You may develop headaches that can be relieved by medicines approved by your health care provider.  You may urinate more often because the fetus is pressing on your bladder.  You may develop or continue to have heartburn as a result of your pregnancy.  You may develop constipation because certain hormones are causing the muscles that push waste through your intestines to slow down.  You may develop hemorrhoids or swollen, bulging veins (varicose veins).  You may have back pain because of the weight gain and pregnancy hormones relaxing your joints between the bones in your pelvis and as a result of a shift in weight and the muscles that support your balance.  Your breasts will continue to grow and be tender.  Your gums may bleed and may be sensitive to brushing and flossing.  Dark spots or blotches (chloasma, mask of pregnancy) may develop on your face. This will likely fade after the baby is born.  A dark line from your belly button to the pubic area (linea nigra) may appear. This will likely fade  after the baby is born.  You may have changes in your hair. These can include thickening of your hair, rapid growth, and changes in texture. Some women also have hair loss during or after pregnancy, or hair that feels dry or thin. Your hair will most likely return to normal after your baby is born. WHAT TO EXPECT AT YOUR PRENATAL VISITS During a routine prenatal visit:  You will be weighed to make sure you and the fetus are growing normally.  Your blood pressure will be taken.  Your abdomen will be measured to track your baby's growth.  The fetal heartbeat will be listened to.  Any test results from the previous visit will be discussed. Your health care provider may ask you:  How you are feeling.  If you are feeling the baby move.  If you have had any abnormal symptoms, such as leaking fluid, bleeding, severe headaches, or abdominal cramping.  If you have any questions. Other tests that may be performed during your second trimester include:  Blood tests that check for:  Low iron levels (anemia).  Gestational diabetes (between 24 and 28 weeks).  Rh antibodies.  Urine tests to check for infections, diabetes, or protein in the urine.  An ultrasound to confirm the proper growth and development of the baby.  An amniocentesis to check for possible genetic problems.  Fetal screens for spina bifida and Down syndrome. HOME CARE INSTRUCTIONS   Avoid all smoking, herbs, alcohol, and unprescribed   drugs. These chemicals affect the formation and growth of the baby.  Follow your health care provider's instructions regarding medicine use. There are medicines that are either safe or unsafe to take during pregnancy.  Exercise only as directed by your health care provider. Experiencing uterine cramps is a good sign to stop exercising.  Continue to eat regular, healthy meals.  Wear a good support bra for breast tenderness.  Do not use hot tubs, steam rooms, or saunas.  Wear your  seat belt at all times when driving.  Avoid raw meat, uncooked cheese, cat litter boxes, and soil used by cats. These carry germs that can cause birth defects in the baby.  Take your prenatal vitamins.  Try taking a stool softener (if your health care provider approves) if you develop constipation. Eat more high-fiber foods, such as fresh vegetables or fruit and whole grains. Drink plenty of fluids to keep your urine clear or pale yellow.  Take warm sitz baths to soothe any pain or discomfort caused by hemorrhoids. Use hemorrhoid cream if your health care provider approves.  If you develop varicose veins, wear support hose. Elevate your feet for 15 minutes, 3-4 times a day. Limit salt in your diet.  Avoid heavy lifting, wear low heel shoes, and practice good posture.  Rest with your legs elevated if you have leg cramps or low back pain.  Visit your dentist if you have not gone yet during your pregnancy. Use a soft toothbrush to brush your teeth and be gentle when you floss.  A sexual relationship may be continued unless your health care provider directs you otherwise.  Continue to go to all your prenatal visits as directed by your health care provider. SEEK MEDICAL CARE IF:   You have dizziness.  You have mild pelvic cramps, pelvic pressure, or nagging pain in the abdominal area.  You have persistent nausea, vomiting, or diarrhea.  You have a bad smelling vaginal discharge.  You have pain with urination. SEEK IMMEDIATE MEDICAL CARE IF:   You have a fever.  You are leaking fluid from your vagina.  You have spotting or bleeding from your vagina.  You have severe abdominal cramping or pain.  You have rapid weight gain or loss.  You have shortness of breath with chest pain.  You notice sudden or extreme swelling of your face, hands, ankles, feet, or legs.  You have not felt your baby move in over an hour.  You have severe headaches that do not go away with  medicine.  You have vision changes. Document Released: 12/26/2000 Document Revised: 01/06/2013 Document Reviewed: 03/04/2012 [facility] Patient Information 2015 [place], [place]. This information is not intended to replace advice given to you by your health care provider. Make sure you discuss any questions you have with your health care provider.  Tdap Vaccine (Tetanus, Diphtheria, Pertussis): What You Need to Know 1. Why get vaccinated? Tetanus, diphtheria and pertussis can be very serious diseases, even for adolescents and adults. Tdap vaccine can protect [place] from these diseases. TETANUS (Lockjaw) causes painful muscle tightening and stiffness, usually all over the body.  It can lead to tightening of muscles in the head and neck so you can't open your mouth, swallow, or sometimes even breathe. Tetanus kills about 1 out of 5 people who are infected. DIPHTHERIA can cause a thick coating to form in the back of the throat.  It can lead to breathing problems, paralysis, heart failure, and death. PERTUSSIS (Whooping Cough) causes severe coughing spells,  which can cause difficulty breathing, vomiting and disturbed sleep.  It can also lead to weight loss, incontinence, and rib fractures. Up to 2 in 100 adolescents and 5 in 100 adults with pertussis are hospitalized or have complications, which could include pneumonia or death. These diseases are caused by bacteria. Diphtheria and pertussis are spread from person to person through coughing or sneezing. Tetanus enters the body through cuts, scratches, or wounds. Before vaccines, the [place] saw as many as 200,000 cases a year of diphtheria and pertussis, and hundreds of cases of tetanus. Since vaccination began, tetanus and diphtheria have dropped by about 99% and pertussis by about 80%. 2. Tdap vaccine Tdap vaccine can protect adolescents and adults from tetanus, diphtheria, and pertussis. One dose of Tdap is routinely given at age 58 or 79. People  who did not get Tdap at that age should get it as soon as possible. Tdap is especially important for health care professionals and anyone having close contact with a baby younger than 12 months. Pregnant women should get a dose of Tdap during every pregnancy, to protect the newborn from pertussis. Infants are most at risk for severe, life-threatening complications from pertussis. A similar vaccine, called Td, protects from tetanus and diphtheria, but not pertussis. A Td booster should be given every 10 years. Tdap may be given as one of these boosters if you have not already gotten a dose. Tdap may also be given after a severe cut or burn to prevent tetanus infection. Your doctor can give you more information. Tdap may safely be given at the same time as other vaccines. 3. Some people should not get this vaccine  If you ever had a life-threatening allergic reaction after a dose of any tetanus, diphtheria, or pertussis containing vaccine, OR if you have a severe allergy to any part of this vaccine, you should not get Tdap. Tell your doctor if you have any severe allergies.  If you had a coma, or long or multiple seizures within 7 days after a childhood dose of DTP or DTaP, you should not get Tdap, unless a cause other than the vaccine was found. You can still get Td.  Talk to your doctor if you:  have epilepsy or another nervous system problem,  had severe pain or swelling after any vaccine containing diphtheria, tetanus or pertussis,  ever had Guillain-Barr Syndrome (GBS),  aren't feeling well on the day the shot is scheduled. 4. Risks of a vaccine reaction With any medicine, including vaccines, there is a chance of side effects. These are usually mild and go away on their own, but serious reactions are also possible. Brief fainting spells can follow a vaccination, leading to injuries from falling. Sitting or lying down for about 15 minutes can help prevent these. Tell your doctor if you feel  dizzy or light-headed, or have vision changes or ringing in the ears. Mild problems following Tdap (Did not interfere with activities)  Pain where the shot was given (about 3 in 4 adolescents or 2 in 3 adults)  Redness or swelling where the shot was given (about 1 person in 5)  Mild fever of at least 100.58F (up to about 1 in 25 adolescents or 1 in 100 adults)  Headache (about 3 or 4 people in 10)  Tiredness (about 1 person in 3 or 4)  Nausea, vomiting, diarrhea, stomach ache (up to 1 in 4 adolescents or 1 in 10 adults)  Chills, body aches, sore joints, rash, swollen glands (uncommon)  Moderate problems following Tdap (Interfered with activities, but did not require medical attention)  Pain where the shot was given (about 1 in 5 adolescents or 1 in 100 adults)  Redness or swelling where the shot was given (up to about 1 in 16 adolescents or 1 in 25 adults)  Fever over 102F (about 1 in 100 adolescents or 1 in 250 adults)  Headache (about 3 in 20 adolescents or 1 in 10 adults)  Nausea, vomiting, diarrhea, stomach ache (up to 1 or 3 people in 100)  Swelling of the entire arm where the shot was given (up to about 3 in 100). Severe problems following Tdap (Unable to perform usual activities; required medical attention)  Swelling, severe pain, bleeding and redness in the arm where the shot was given (rare). A severe allergic reaction could occur after any vaccine (estimated less than 1 in a million doses). 5. What if there is a serious reaction? What should I look for?  Look for anything that concerns you, such as signs of a severe allergic reaction, very high fever, or behavior changes. Signs of a severe allergic reaction can include hives, swelling of the face and throat, difficulty breathing, a fast heartbeat, dizziness, and weakness. These would start a few minutes to a few hours after the vaccination. What should I do?  If you think it is a severe allergic reaction or other  emergency that can't wait, call 9-1-1 or get the person to the nearest hospital. Otherwise, call your doctor.  Afterward, the reaction should be reported to the "Vaccine Adverse Event Reporting System" (VAERS). Your doctor might file this report, or you can do it yourself through the VAERS web site at www.vaers.SamedayNews.es, or by calling (775) 766-4884. VAERS is only for reporting reactions. They do not give medical advice.  6. The National Vaccine Injury Compensation Program The [employer] Vaccine Injury Compensation Program (VICP) is a federal program that was created to compensate people who may have been injured by certain vaccines. Persons who believe they may have been injured by a vaccine can learn about the program and about filing a claim by calling 902-334-4609 or visiting the [employer] website at GoldCloset.com.ee. 7. How can I learn more?  Ask your doctor.  Call your local or state health department.  Contact the Centers for Disease Control and Prevention (CDC):  Call 9021452991 or visit CDC's website at http://hunter.com/. CDC Tdap Vaccine VIS (05/24/11) Document Released: 07/03/2011 Document Revised: 05/18/2013 Document Reviewed: 04/15/2013 [employer] Patient Information 2015 [place], [place]. This information is not intended to replace advice given to you by your health care provider. Make sure you discuss any questions you have with your health care provider.

## 2013-08-25 NOTE — Progress Notes (Signed)
[facility] [place] scheduled 28 weeks. 28 week labs at [facility].

## 2013-08-31 ENCOUNTER — Other Ambulatory Visit: Payer: Self-pay | Admitting: Advanced Practice Midwife

## 2013-08-31 DIAGNOSIS — O289 Unspecified abnormal findings on antenatal screening of mother: Secondary | ICD-10-CM

## 2013-08-31 DIAGNOSIS — O34219 Maternal care for unspecified type scar from previous cesarean delivery: Secondary | ICD-10-CM

## 2013-08-31 DIAGNOSIS — Z8751 Personal history of pre-term labor: Secondary | ICD-10-CM

## 2013-08-31 DIAGNOSIS — O99340 Other mental disorders complicating pregnancy, unspecified trimester: Secondary | ICD-10-CM

## 2013-09-10 ENCOUNTER — Encounter: Payer: Self-pay | Admitting: Advanced Practice Midwife

## 2013-09-14 ENCOUNTER — Telehealth: Payer: Self-pay

## 2013-09-14 NOTE — Telephone Encounter (Signed)
Patient sent myChart message stating she has been having pressure between legs and some contractions and wants to know if it something she should be worried about. Called patient and left message stating we are calling in regards to your mychart message, if you still have questions or concerns please call clinic.

## 2013-09-17 ENCOUNTER — Encounter (HOSPITAL_COMMUNITY): Payer: Self-pay

## 2013-09-17 ENCOUNTER — Ambulatory Visit (HOSPITAL_COMMUNITY)
Admission: RE | Admit: 2013-09-17 | Discharge: 2013-09-17 | Disposition: A | Discharge status: Home / Self Care | Payer: 59 | Source: Ambulatory Visit | Attending: Family Medicine | Admitting: Family Medicine

## 2013-09-17 VITALS — BP 119/64 | HR 99 | Wt 206.0 lb

## 2013-09-17 DIAGNOSIS — O99340 Other mental disorders complicating pregnancy, unspecified trimester: Secondary | ICD-10-CM | POA: Insufficient documentation

## 2013-09-17 DIAGNOSIS — O99330 Smoking (tobacco) complicating pregnancy, unspecified trimester: Secondary | ICD-10-CM | POA: Diagnosis not present

## 2013-09-17 DIAGNOSIS — O289 Unspecified abnormal findings on antenatal screening of mother: Secondary | ICD-10-CM | POA: Diagnosis not present

## 2013-09-17 DIAGNOSIS — O285 Abnormal chromosomal and genetic finding on antenatal screening of mother: Secondary | ICD-10-CM

## 2013-09-17 DIAGNOSIS — O09212 Supervision of pregnancy with history of pre-term labor, second trimester: Secondary | ICD-10-CM

## 2013-09-17 DIAGNOSIS — F17200 Nicotine dependence, unspecified, uncomplicated: Secondary | ICD-10-CM

## 2013-09-17 DIAGNOSIS — O34219 Maternal care for unspecified type scar from previous cesarean delivery: Secondary | ICD-10-CM | POA: Insufficient documentation

## 2013-09-17 DIAGNOSIS — O09892 Supervision of other high risk pregnancies, second trimester: Secondary | ICD-10-CM

## 2013-09-17 DIAGNOSIS — Z8751 Personal history of pre-term labor: Secondary | ICD-10-CM | POA: Diagnosis not present

## 2013-09-22 ENCOUNTER — Ambulatory Visit (INDEPENDENT_AMBULATORY_CARE_PROVIDER_SITE_OTHER): Payer: 59 | Admitting: Advanced Practice Midwife

## 2013-09-22 VITALS — BP 128/68 | HR 95 | Wt 202.8 lb

## 2013-09-22 DIAGNOSIS — Z3482 Encounter for supervision of other normal pregnancy, second trimester: Secondary | ICD-10-CM

## 2013-09-22 DIAGNOSIS — Z3480 Encounter for supervision of other normal pregnancy, unspecified trimester: Secondary | ICD-10-CM

## 2013-09-22 DIAGNOSIS — O34219 Maternal care for unspecified type scar from previous cesarean delivery: Secondary | ICD-10-CM

## 2013-09-22 DIAGNOSIS — Z23 Encounter for immunization: Secondary | ICD-10-CM

## 2013-09-22 DIAGNOSIS — O285 Abnormal chromosomal and genetic finding on antenatal screening of mother: Secondary | ICD-10-CM | POA: Insufficient documentation

## 2013-09-22 DIAGNOSIS — Z8742 Personal history of other diseases of the female genital tract: Secondary | ICD-10-CM

## 2013-09-22 LAB — POCT URINALYSIS DIP (DEVICE)
Bilirubin Urine: NEGATIVE
GLUCOSE, UA: NEGATIVE mg/dL
Hgb urine dipstick: NEGATIVE
Ketones, ur: NEGATIVE mg/dL
Leukocytes, UA: NEGATIVE
NITRITE: NEGATIVE
Protein, ur: NEGATIVE mg/dL
SPECIFIC GRAVITY, URINE: 1.025 (ref 1.005–1.030)
UROBILINOGEN UA: 0.2 mg/dL (ref 0.0–1.0)
pH: 6 (ref 5.0–8.0)

## 2013-09-22 LAB — CBC
HEMATOCRIT: 34.6 % — AB (ref 36.0–46.0)
Hemoglobin: 11.2 g/dL — ABNORMAL LOW (ref 12.0–15.0)
MCH: 27.3 pg (ref 26.0–34.0)
MCHC: 32.4 g/dL (ref 30.0–36.0)
MCV: 84.4 fL (ref 78.0–100.0)
PLATELETS: 326 10*3/uL (ref 150–400)
RBC: 4.1 MIL/uL (ref 3.87–5.11)
RDW: 14 % (ref 11.5–15.5)
WBC: 8.4 10*3/uL (ref 4.0–10.5)

## 2013-09-22 LAB — GLUCOSE TOLERANCE, 1 HOUR (50G) W/O FASTING: GLUCOSE 1 HOUR GTT: 132 mg/dL (ref 70–140)

## 2013-09-22 LAB — HIV ANTIBODY (ROUTINE TESTING W REFLEX): HIV: NONREACTIVE

## 2013-09-22 LAB — RPR

## 2013-09-22 MED ORDER — TETANUS-DIPHTH-ACELL PERTUSSIS 5-2.5-18.5 LF-MCG/0.5 IM SUSP
0.5000 mL | Freq: Once | INTRAMUSCULAR | Status: AC
  Administered 2013-09-22: 0.5 mL via INTRAMUSCULAR

## 2013-09-22 NOTE — Addendum Note (Signed)
Addended by: Fatima Blank A on: 09/22/2013 09:35 AM   Modules accepted: Level of Service

## 2013-09-22 NOTE — Progress Notes (Signed)
Doing well.  Good fetal movement, denies vaginal bleeding, LOF, regular contractions.  Some irregular cramping a few days ago, resolved.  Reviewed normal U/S results with pt, repeat scheduled in October.  Glucose screen/28 week labs today.

## 2013-09-22 NOTE — Progress Notes (Signed)
28 week labs today. 1hr due at  Patient would like tdap and flu shot.

## 2013-10-06 ENCOUNTER — Encounter: Payer: Self-pay | Admitting: Obstetrics and Gynecology

## 2013-10-06 ENCOUNTER — Ambulatory Visit (INDEPENDENT_AMBULATORY_CARE_PROVIDER_SITE_OTHER): Payer: 59 | Admitting: Obstetrics and Gynecology

## 2013-10-06 VITALS — BP 122/73 | HR 102 | Wt 203.0 lb

## 2013-10-06 DIAGNOSIS — O09291 Supervision of pregnancy with other poor reproductive or obstetric history, first trimester: Secondary | ICD-10-CM

## 2013-10-06 DIAGNOSIS — O09299 Supervision of pregnancy with other poor reproductive or obstetric history, unspecified trimester: Secondary | ICD-10-CM

## 2013-10-06 LAB — POCT URINALYSIS DIP (DEVICE)
BILIRUBIN URINE: NEGATIVE
GLUCOSE, UA: NEGATIVE mg/dL
Hgb urine dipstick: NEGATIVE
Ketones, ur: NEGATIVE mg/dL
Leukocytes, UA: NEGATIVE
NITRITE: NEGATIVE
Protein, ur: NEGATIVE mg/dL
Specific Gravity, Urine: 1.02 (ref 1.005–1.030)
Urobilinogen, UA: 0.2 mg/dL (ref 0.0–1.0)
pH: 6.5 (ref 5.0–8.0)

## 2013-10-06 NOTE — Patient Instructions (Signed)
Third Trimester of Pregnancy The third trimester is from week 29 through week 42, months 7 through 9. The third trimester is a time when the fetus is growing rapidly. At the end of the ninth month, the fetus is about 20 inches in length and weighs 6-10 pounds.  BODY CHANGES Your body goes through many changes during pregnancy. The changes vary from woman to woman.   Your weight will continue to increase. You can expect to gain 25-35 pounds (11-16 kg) by the end of the pregnancy.  You may begin to get stretch marks on your hips, abdomen, and breasts.  You may urinate more often because the fetus is moving lower into your pelvis and pressing on your bladder.  You may develop or continue to have heartburn as a result of your pregnancy.  You may develop constipation because certain hormones are causing the muscles that push waste through your intestines to slow down.  You may develop hemorrhoids or swollen, bulging veins (varicose veins).  You may have pelvic pain because of the weight gain and pregnancy hormones relaxing your joints between the bones in your pelvis. Backaches may result from overexertion of the muscles supporting your posture.  You may have changes in your hair. These can include thickening of your hair, rapid growth, and changes in texture. Some women also have hair loss during or after pregnancy, or hair that feels dry or thin. Your hair will most likely return to normal after your baby is born.  Your breasts will continue to grow and be tender. A yellow discharge may leak from your breasts called colostrum.  Your belly button may stick out.  You may feel short of breath because of your expanding uterus.  You may notice the fetus "dropping," or moving lower in your abdomen.  You may have a bloody mucus discharge. This usually occurs a few days to a week before labor begins.  Your cervix becomes thin and soft (effaced) near your due date. WHAT TO EXPECT AT YOUR PRENATAL  EXAMS  You will have prenatal exams every 2 weeks until week 36. Then, you will have weekly prenatal exams. During a routine prenatal visit:  You will be weighed to make sure you and the fetus are growing normally.  Your blood pressure is taken.  Your abdomen will be measured to track your baby's growth.  The fetal heartbeat will be listened to.  Any test results from the previous visit will be discussed.  You may have a cervical check near your due date to see if you have effaced. At around 36 weeks, your caregiver will check your cervix. At the same time, your caregiver will also perform a test on the secretions of the vaginal tissue. This test is to determine if a type of bacteria, Group B streptococcus, is present. Your caregiver will explain this further. Your caregiver may ask you:  What your birth plan is.  How you are feeling.  If you are feeling the baby move.  If you have had any abnormal symptoms, such as leaking fluid, bleeding, severe headaches, or abdominal cramping.  If you have any questions. Other tests or screenings that may be performed during your third trimester include:  Blood tests that check for low iron levels (anemia).  Fetal testing to check the health, activity level, and growth of the fetus. Testing is done if you have certain medical conditions or if there are problems during the pregnancy. FALSE LABOR You may feel small, irregular contractions that   eventually go away. These are called Braxton Hicks contractions, or false labor. Contractions may last for hours, days, or even weeks before true labor sets in. If contractions come at regular intervals, intensify, or become painful, it is best to be seen by your caregiver.  SIGNS OF LABOR   Menstrual-like cramps.  Contractions that are 5 minutes apart or less.  Contractions that start on the top of the uterus and spread down to the lower abdomen and back.  A sense of increased pelvic pressure or back  pain.  A watery or bloody mucus discharge that comes from the vagina. If you have any of these signs before the 37th week of pregnancy, call your caregiver right away. You need to go to the hospital to get checked immediately. HOME CARE INSTRUCTIONS   Avoid all smoking, herbs, alcohol, and unprescribed drugs. These chemicals affect the formation and growth of the baby.  Follow your caregiver's instructions regarding medicine use. There are medicines that are either safe or unsafe to take during pregnancy.  Exercise only as directed by your caregiver. Experiencing uterine cramps is a good sign to stop exercising.  Continue to eat regular, healthy meals.  Wear a good support bra for breast tenderness.  Do not use hot tubs, steam rooms, or saunas.  Wear your seat belt at all times when driving.  Avoid raw meat, uncooked cheese, cat litter boxes, and soil used by cats. These carry germs that can cause birth defects in the baby.  Take your prenatal vitamins.  Try taking a stool softener (if your caregiver approves) if you develop constipation. Eat more high-fiber foods, such as fresh vegetables or fruit and whole grains. Drink plenty of fluids to keep your urine clear or pale yellow.  Take warm sitz baths to soothe any pain or discomfort caused by hemorrhoids. Use hemorrhoid cream if your caregiver approves.  If you develop varicose veins, wear support hose. Elevate your feet for 15 minutes, 3-4 times a day. Limit salt in your diet.  Avoid heavy lifting, wear low heal shoes, and practice good posture.  Rest a lot with your legs elevated if you have leg cramps or low back pain.  Visit your dentist if you have not gone during your pregnancy. Use a soft toothbrush to brush your teeth and be gentle when you floss.  A sexual relationship may be continued unless your caregiver directs you otherwise.  Do not travel far distances unless it is absolutely necessary and only with the approval  of your caregiver.  Take prenatal classes to understand, practice, and ask questions about the labor and delivery.  Make a trial run to the hospital.  Pack your hospital bag.  Prepare the baby's nursery.  Continue to go to all your prenatal visits as directed by your caregiver. SEEK MEDICAL CARE IF:  You are unsure if you are in labor or if your water has broken.  You have dizziness.  You have mild pelvic cramps, pelvic pressure, or nagging pain in your abdominal area.  You have persistent nausea, vomiting, or diarrhea.  You have a bad smelling vaginal discharge.  You have pain with urination. SEEK IMMEDIATE MEDICAL CARE IF:   You have a fever.  You are leaking fluid from your vagina.  You have spotting or bleeding from your vagina.  You have severe abdominal cramping or pain.  You have rapid weight loss or gain.  You have shortness of breath with chest pain.  You notice sudden or extreme swelling   of your face, hands, ankles, feet, or legs.  You have not felt your baby move in over an hour.  You have severe headaches that do not go away with medicine.  You have vision changes. Document Released: 12/26/2000 Document Revised: 01/06/2013 Document Reviewed: 03/04/2012 [employer] Patient Information 2015 [employer]. This information is not intended to replace advice given to you by your health care provider. Make sure you discuss any questions you have with your health care provider.  

## 2013-10-06 NOTE — Progress Notes (Signed)
C/o of occasional pain at tailbone.  Occasional edema in hands and feet.

## 2013-10-06 NOTE — Progress Notes (Signed)
Low PAPP-A> [place] at 27 wks 51%ile growth, nl AFV, CL 3.2cm> repeat 6 wks from 9/3.  Denies PTL sx. GFM. No sx HSV. GCT 132. Plans breastfeeding, PPS (has private insurance).

## 2013-10-12 ENCOUNTER — Encounter (HOSPITAL_COMMUNITY): Payer: Self-pay | Admitting: *Deleted

## 2013-10-12 ENCOUNTER — Inpatient Hospital Stay (HOSPITAL_COMMUNITY)
Admission: AD | Admit: 2013-10-12 | Discharge: 2013-10-12 | Disposition: A | Discharge status: Home / Self Care | Payer: 59 | Source: Ambulatory Visit | Attending: Family Medicine | Admitting: Family Medicine

## 2013-10-12 DIAGNOSIS — O09291 Supervision of pregnancy with other poor reproductive or obstetric history, first trimester: Secondary | ICD-10-CM

## 2013-10-12 DIAGNOSIS — O285 Abnormal chromosomal and genetic finding on antenatal screening of mother: Secondary | ICD-10-CM

## 2013-10-12 DIAGNOSIS — O99330 Smoking (tobacco) complicating pregnancy, unspecified trimester: Secondary | ICD-10-CM | POA: Insufficient documentation

## 2013-10-12 DIAGNOSIS — O479 False labor, unspecified: Secondary | ICD-10-CM

## 2013-10-12 DIAGNOSIS — O99891 Other specified diseases and conditions complicating pregnancy: Secondary | ICD-10-CM | POA: Diagnosis not present

## 2013-10-12 DIAGNOSIS — O34219 Maternal care for unspecified type scar from previous cesarean delivery: Secondary | ICD-10-CM

## 2013-10-12 DIAGNOSIS — Z3482 Encounter for supervision of other normal pregnancy, second trimester: Secondary | ICD-10-CM

## 2013-10-12 DIAGNOSIS — O289 Unspecified abnormal findings on antenatal screening of mother: Secondary | ICD-10-CM

## 2013-10-12 DIAGNOSIS — O9989 Other specified diseases and conditions complicating pregnancy, childbirth and the puerperium: Principal | ICD-10-CM

## 2013-10-12 DIAGNOSIS — O4700 False labor before 37 completed weeks of gestation, unspecified trimester: Secondary | ICD-10-CM | POA: Diagnosis present

## 2013-10-12 LAB — URINALYSIS, ROUTINE W REFLEX MICROSCOPIC
Glucose, UA: NEGATIVE mg/dL
Hgb urine dipstick: NEGATIVE
Ketones, ur: NEGATIVE mg/dL
Leukocytes, UA: NEGATIVE
Nitrite: NEGATIVE
Protein, ur: 30 mg/dL — AB
Specific Gravity, Urine: 1.025 (ref 1.005–1.030)
Urobilinogen, UA: 0.2 mg/dL (ref 0.0–1.0)
pH: 6.5 (ref 5.0–8.0)

## 2013-10-12 LAB — URINE MICROSCOPIC-ADD ON

## 2013-10-12 LAB — WET PREP, GENITAL
Clue Cells Wet Prep HPF POC: NONE SEEN
Trich, Wet Prep: NONE SEEN
Yeast Wet Prep HPF POC: NONE SEEN

## 2013-10-12 NOTE — Discharge Instructions (Signed)
Braxton Hicks Contractions °Contractions of the uterus can occur throughout pregnancy. Contractions are not always a sign that you are in labor.  °WHAT ARE BRAXTON HICKS CONTRACTIONS?  °Contractions that occur before labor are called Braxton Hicks contractions, or false labor. Toward the end of pregnancy (32-34 weeks), these contractions can develop more often and may become more forceful. This is not true labor because these contractions do not result in opening (dilatation) and thinning of the cervix. They are sometimes difficult to tell apart from true labor because these contractions can be forceful and people have different pain tolerances. You should not feel embarrassed if you go to the hospital with false labor. Sometimes, the only way to tell if you are in true labor is for your health care provider to look for changes in the cervix. °If there are no prenatal problems or other health problems associated with the pregnancy, it is completely safe to be sent home with false labor and await the onset of true labor. °HOW CAN YOU TELL THE DIFFERENCE BETWEEN TRUE AND FALSE LABOR? °False Labor °· The contractions of false labor are usually shorter and not as hard as those of true labor.   °· The contractions are usually irregular.   °· The contractions are often felt in the front of the lower abdomen and in the groin.   °· The contractions may go away when you walk around or change positions while lying down.   °· The contractions get weaker and are shorter lasting as time goes on.   °· The contractions do not usually become progressively stronger, regular, and closer together as with true labor.   °True Labor °· Contractions in true labor last 30-70 seconds, become very regular, usually become more intense, and increase in frequency.   °· The contractions do not go away with walking.   °· The discomfort is usually felt in the top of the uterus and spreads to the lower abdomen and low back.   °· True labor can be  determined by your health care provider with an exam. This will show that the cervix is dilating and getting thinner.   °WHAT TO REMEMBER °· Keep up with your usual exercises and follow other instructions given by your health care provider.   °· Take medicines as directed by your health care provider.   °· Keep your regular prenatal appointments.   °· Eat and drink lightly if you think you are going into labor.   °· If Braxton Hicks contractions are making you uncomfortable:   °¨ Change your position from lying down or resting to walking, or from walking to resting.   °¨ Sit and rest in a tub of warm water.   °¨ Drink 2-3 glasses of water. Dehydration may cause these contractions.   °¨ Do slow and deep breathing several times an hour.   °WHEN SHOULD I SEEK IMMEDIATE MEDICAL CARE? °Seek immediate medical care if: °· Your contractions become stronger, more regular, and closer together.   °· You have fluid leaking or gushing from your vagina.   °· You have a fever.   °· You pass blood-tinged mucus.   °· You have vaginal bleeding.   °· You have continuous abdominal pain.   °· You have low back pain that you never had before.   °· You feel your baby's head pushing down and causing pelvic pressure.   °· Your baby is not moving as much as it used to.   °Document Released: 01/01/2005 Document Revised: 01/06/2013 Document Reviewed: 10/13/2012 °Exit[employer]® Patient Information ©2015 [employer]. This information is not intended to replace advice given to you by your health care   provider. Make sure you discuss any questions you have with your health care provider. ° °

## 2013-10-12 NOTE — MAU Provider Note (Signed)
  History     CSN: [id number]  Arrival date and time: 10/12/13 1019   None     Chief Complaint  Patient presents with  . Labor Eval   HPI  Patient is 33 y.o. [id number] [redacted]w[redacted]d.  +FM, denies LOF, VB, vaginal discharge.  Overall feeling well  Contractions since 6AM, up to 1-2/hour, lasting couple of minutes, 5-6/10.  No dysuria/frequency/hematuria.  Past Medical History  Diagnosis Date  . HSV-2 infection   . Fibroid   . Headache(784.0)   . Ovarian cyst   . Abnormal Pap smear     HPV  . Infection     UTI, chlamydia    Past Surgical History  Procedure Laterality Date  . Cesarean section    . Tonsillectomy    . Adnoidectomy    . Myomectomy      Family History  Problem Relation Age of Onset  . Hypertension Mother   . Diabetes Mother   . Diabetes Father   . Heart disease Father     History  Substance Use Topics  . Smoking status: Current Some Day Smoker -- 0.25 packs/day for 12 years    Types: Cigarettes  . Smokeless tobacco: Never Used  . Alcohol Use: Yes     Comment: weekends; not with preg    Allergies:  Allergies  Allergen Reactions  . Banana Itching    Prescriptions prior to admission  Medication Sig Dispense Refill  . Prenatal Vit-Fe Fumarate-FA (PRENATAL MULTIVITAMIN) TABS tablet Take 1 tablet by mouth daily at 12 noon.        Review of Systems  Constitutional: Negative for fever and chills.  Respiratory: Negative for cough and shortness of breath.   Cardiovascular: Negative for chest pain and leg swelling.  Gastrointestinal: Positive for nausea. Negative for heartburn, vomiting and diarrhea.  Genitourinary: Negative for dysuria, urgency, frequency and hematuria.  Neurological: Positive for headaches.   Physical Exam   Blood pressure 133/62, pulse 96, temperature 99.4 F (37.4 C), temperature source Oral, resp. rate 18, height 5\' 6"  (1.676 m), weight 208 lb (94.348 kg), last menstrual period 03/12/2013, unknown if currently  breastfeeding.  Physical Exam  Constitutional: She is oriented to person, place, and time. She appears well-developed and well-nourished. No distress.  Very comfortable, no contractions during 15 minutes I was in room with patient.  HENT:  Head: Normocephalic and atraumatic.  Eyes: Conjunctivae and EOM are normal.  Neck: Normal range of motion.  Cardiovascular: Normal rate, regular rhythm and normal heart sounds.   Respiratory: Effort normal. No respiratory distress.  GI: Soft. Bowel sounds are normal. She exhibits no distension. There is no tenderness.  Musculoskeletal: Normal range of motion. She exhibits no edema.  Neurological: She is alert and oriented to person, place, and time.  Skin: Skin is warm and dry. No erythema.    MAU Course  Procedures  MDM NST reactive for GA, no contractions noted UA normal Wet prep few WBC Assessment and Plan  No contractions, cervix closed, no signs of labor.  Discharge with labor precautions.  Erika Gonzales ROCIO 10/12/2013, 11:30 AM

## 2013-10-12 NOTE — Progress Notes (Signed)
Dr Deniece Ree notified of pt's arrival and complaints states that she will come and evaluate pt

## 2013-10-12 NOTE — MAU Note (Signed)
Contracting, pressure into hips and legs since early this mroning. Hx of PT, PTD.

## 2013-10-14 NOTE — MAU Provider Note (Signed)
Attestation of Attending Supervision of Obstetric Fellow: Evaluation and management procedures were performed by the Obstetric Fellow under my supervision and collaboration.  I have reviewed the Obstetric Fellow's note and chart, and I agree with the management and plan.  Jacob Stinson, DO Attending Physician Faculty Practice, [facility]  

## 2013-10-20 ENCOUNTER — Ambulatory Visit (INDEPENDENT_AMBULATORY_CARE_PROVIDER_SITE_OTHER): Payer: 59 | Admitting: Advanced Practice Midwife

## 2013-10-20 VITALS — BP 124/68 | HR 99 | Wt 209.0 lb

## 2013-10-20 DIAGNOSIS — K439 Ventral hernia without obstruction or gangrene: Secondary | ICD-10-CM

## 2013-10-20 DIAGNOSIS — O34219 Maternal care for unspecified type scar from previous cesarean delivery: Secondary | ICD-10-CM

## 2013-10-20 DIAGNOSIS — O3421 Maternal care for scar from previous cesarean delivery: Secondary | ICD-10-CM

## 2013-10-20 DIAGNOSIS — Z3483 Encounter for supervision of other normal pregnancy, third trimester: Secondary | ICD-10-CM

## 2013-10-20 LAB — POCT URINALYSIS DIP (DEVICE)
Bilirubin Urine: NEGATIVE
Glucose, UA: NEGATIVE mg/dL
Hgb urine dipstick: NEGATIVE
Ketones, ur: NEGATIVE mg/dL
Leukocytes, UA: NEGATIVE
Nitrite: NEGATIVE
Protein, ur: NEGATIVE mg/dL
Specific Gravity, Urine: 1.025 (ref 1.005–1.030)
Urobilinogen, UA: 0.2 mg/dL (ref 0.0–1.0)
pH: 6 (ref 5.0–8.0)

## 2013-10-20 MED ORDER — SUMATRIPTAN SUCCINATE 50 MG PO TABS: 50.0000 mg | ORAL_TABLET | ORAL | Status: DC | PRN

## 2013-10-20 MED ORDER — BUTALBITAL-APAP-CAFFEINE 50-325-40 MG PO TABS: 2.0000 | ORAL_TABLET | Freq: Four times a day (QID) | ORAL | Status: DC | PRN

## 2013-10-20 NOTE — Progress Notes (Signed)
Plans BTL. Has private insurance. Plans repeat C/S at 39 weeks unless she comes in in Active labor. Ventral hernia. Gen surgery referral.

## 2013-10-20 NOTE — Patient Instructions (Signed)
[facility]  Migraine Headache A migraine headache is an intense, throbbing pain on one or both sides of your head. A migraine can last for 30 minutes to several hours. CAUSES  The exact cause of a migraine headache is not always known. However, a migraine may be caused when nerves in the brain become irritated and release chemicals that cause inflammation. This causes pain. Certain things may also trigger migraines, such as:  Alcohol.  Smoking.  Stress.  Menstruation.  Aged cheeses.  Foods or drinks that contain nitrates, glutamate, aspartame, or tyramine.  Lack of sleep.  Chocolate.  Caffeine.  Hunger.  Physical exertion.  Fatigue.  Medicines used to treat chest pain (nitroglycerine), birth control pills, estrogen, and some blood pressure medicines. SIGNS AND SYMPTOMS  Pain on one or both sides of your head.  Pulsating or throbbing pain.  Severe pain that prevents daily activities.  Pain that is aggravated by any physical activity.  Nausea, vomiting, or both.  Dizziness.  Pain with exposure to bright lights, loud noises, or activity.  General sensitivity to bright lights, loud noises, or smells. Before you get a migraine, you may get warning signs that a migraine is coming (aura). An aura may include:  Seeing flashing lights.  Seeing bright spots, halos, or zigzag lines.  Having tunnel vision or blurred vision.  Having feelings of numbness or tingling.  Having trouble talking.  Having muscle weakness. DIAGNOSIS  A migraine headache is often diagnosed based on:  Symptoms.  Physical exam.  A CT scan or MRI of your head. These imaging tests cannot diagnose migraines, but they can help rule out other causes of headaches. TREATMENT Medicines may be given for pain and nausea. Medicines can also be given to help prevent recurrent migraines.  HOME CARE INSTRUCTIONS  Only take over-the-counter or prescription medicines for pain or discomfort as  directed by your health care provider. The use of long-term narcotics is not recommended.  Lie down in a dark, quiet room when you have a migraine.  Keep a journal to find out what may trigger your migraine headaches. For example, write down:  What you eat and drink.  How much sleep you get.  Any change to your diet or medicines.  Limit alcohol consumption.  Quit smoking if you smoke.  Get 7-9 hours of sleep, or as recommended by your health care provider.  Limit stress.  Keep lights dim if bright lights bother you and make your migraines worse. SEEK IMMEDIATE MEDICAL CARE IF:   Your migraine becomes severe.  You have a fever.  You have a stiff neck.  You have vision loss.  You have muscular weakness or loss of muscle control.  You start losing your balance or have trouble walking.  You feel faint or pass out.  You have severe symptoms that are different from your first symptoms. MAKE SURE YOU:   Understand these instructions.  Will watch your condition.  Will get help right away if you are not doing well or get worse. Document Released: 01/01/2005 Document Revised: 05/18/2013 Document Reviewed: 09/08/2012 [facility] Patient Information 2015 [place], [place]. This information is not intended to replace advice given to you by your health care provider. Make sure you discuss any questions you have with your health care provider.

## 2013-10-20 NOTE — Progress Notes (Signed)
Pt reports increase in swelling in the past week with headaches not relieved by Tylenol.

## 2013-10-26 ENCOUNTER — Telehealth: Payer: Self-pay | Admitting: General Practice

## 2013-10-26 ENCOUNTER — Encounter: Payer: Self-pay | Admitting: Advanced Practice Midwife

## 2013-10-26 NOTE — Telephone Encounter (Signed)
Patient sent following mychart message: "Hi I had a question about my Fmla paperwork. I needed 10/12/13 added missed work after being seen at maternity admissions stayed out entire day. I also recently missed 10/22/13 due to illness related to pregnancy (migraines, swelling and vomiting)." Called patient to discuss her questions as I am unsure she understands FMLA, no answer- left message stating that we are calling in regards to her mychart message and to discuss the questions she had if she will give [place] a call back

## 2013-10-27 ENCOUNTER — Encounter: Payer: Self-pay | Admitting: General Practice

## 2013-10-27 NOTE — Telephone Encounter (Signed)
Pt returned call to clinic and request a call back  Attempted to contact patient, no answer, left message for patient that is she needs additional paperwork to be filled out we will need new forms.  If this is not the case, she may leave a detailed message as to what it is concerning.

## 2013-10-27 NOTE — Telephone Encounter (Signed)
Patient called and left message stating she is returning our call. 

## 2013-10-27 NOTE — Telephone Encounter (Signed)
Called patient, no answer- left message that we are trying to return your call, please call us back at the clinics

## 2013-10-28 ENCOUNTER — Encounter: Payer: Self-pay | Admitting: *Deleted

## 2013-10-28 NOTE — Telephone Encounter (Signed)
Corrected days added to FMLA form/faxed to [employer].  Attempted to contact patient, no answer, left message that FMLA had been corrected and faxed.

## 2013-10-28 NOTE — Telephone Encounter (Signed)
Called patient, no answer- left message stating we are trying to reach you to discuss your concerns about your paperwork, please give [place] a call back and also let us know when a good time to contact you would be.

## 2013-10-29 ENCOUNTER — Encounter (HOSPITAL_COMMUNITY): Payer: Self-pay | Admitting: General Practice

## 2013-10-29 ENCOUNTER — Inpatient Hospital Stay (HOSPITAL_COMMUNITY)
Admission: AD | Admit: 2013-10-29 | Discharge: 2013-10-29 | Disposition: A | Discharge status: Home / Self Care | Payer: 59 | Source: Ambulatory Visit | Attending: Family Medicine | Admitting: Family Medicine

## 2013-10-29 ENCOUNTER — Ambulatory Visit (HOSPITAL_COMMUNITY)
Admission: RE | Admit: 2013-10-29 | Discharge: 2013-10-29 | Disposition: A | Discharge status: Home / Self Care | Payer: 59 | Source: Ambulatory Visit | Attending: Advanced Practice Midwife | Admitting: Advanced Practice Midwife

## 2013-10-29 ENCOUNTER — Other Ambulatory Visit: Payer: Self-pay

## 2013-10-29 ENCOUNTER — Encounter (HOSPITAL_COMMUNITY): Payer: Self-pay

## 2013-10-29 VITALS — BP 115/40 | HR 48 | Wt 208.2 lb

## 2013-10-29 DIAGNOSIS — R0602 Shortness of breath: Secondary | ICD-10-CM | POA: Insufficient documentation

## 2013-10-29 DIAGNOSIS — O285 Abnormal chromosomal and genetic finding on antenatal screening of mother: Secondary | ICD-10-CM | POA: Diagnosis not present

## 2013-10-29 DIAGNOSIS — I493 Ventricular premature depolarization: Secondary | ICD-10-CM

## 2013-10-29 DIAGNOSIS — F17210 Nicotine dependence, cigarettes, uncomplicated: Secondary | ICD-10-CM | POA: Insufficient documentation

## 2013-10-29 DIAGNOSIS — O99330 Smoking (tobacco) complicating pregnancy, unspecified trimester: Secondary | ICD-10-CM | POA: Insufficient documentation

## 2013-10-29 DIAGNOSIS — Z3A00 Weeks of gestation of pregnancy not specified: Secondary | ICD-10-CM | POA: Diagnosis not present

## 2013-10-29 DIAGNOSIS — O99333 Smoking (tobacco) complicating pregnancy, third trimester: Secondary | ICD-10-CM | POA: Insufficient documentation

## 2013-10-29 DIAGNOSIS — O9989 Other specified diseases and conditions complicating pregnancy, childbirth and the puerperium: Secondary | ICD-10-CM | POA: Diagnosis not present

## 2013-10-29 DIAGNOSIS — Z3A33 33 weeks gestation of pregnancy: Secondary | ICD-10-CM | POA: Diagnosis not present

## 2013-10-29 DIAGNOSIS — I517 Cardiomegaly: Secondary | ICD-10-CM

## 2013-10-29 DIAGNOSIS — O289 Unspecified abnormal findings on antenatal screening of mother: Secondary | ICD-10-CM

## 2013-10-29 DIAGNOSIS — R002 Palpitations: Secondary | ICD-10-CM | POA: Insufficient documentation

## 2013-10-29 DIAGNOSIS — F17200 Nicotine dependence, unspecified, uncomplicated: Secondary | ICD-10-CM

## 2013-10-29 DIAGNOSIS — O26893 Other specified pregnancy related conditions, third trimester: Secondary | ICD-10-CM

## 2013-10-29 LAB — CBC
HEMATOCRIT: 34.3 % — AB (ref 36.0–46.0)
Hemoglobin: 11.4 g/dL — ABNORMAL LOW (ref 12.0–15.0)
MCH: 28.2 pg (ref 26.0–34.0)
MCHC: 33.2 g/dL (ref 30.0–36.0)
MCV: 84.9 fL (ref 78.0–100.0)
Platelets: 279 10*3/uL (ref 150–400)
RBC: 4.04 MIL/uL (ref 3.87–5.11)
RDW: 14 % (ref 11.5–15.5)
WBC: 7.7 10*3/uL (ref 4.0–10.5)

## 2013-10-29 LAB — COMPREHENSIVE METABOLIC PANEL
ALBUMIN: 2.6 g/dL — AB (ref 3.5–5.2)
ALK PHOS: 249 U/L — AB (ref 39–117)
ALT: 17 U/L (ref 0–35)
AST: 25 U/L (ref 0–37)
Anion gap: 11 (ref 5–15)
BUN: 6 mg/dL (ref 6–23)
CHLORIDE: 103 meq/L (ref 96–112)
CO2: 22 mEq/L (ref 19–32)
Calcium: 8.5 mg/dL (ref 8.4–10.5)
Creatinine, Ser: 0.5 mg/dL (ref 0.50–1.10)
GFR calc Af Amer: >90 mL/min (ref >90)
GFR calc non Af Amer: >90 mL/min (ref >90)
Glucose, Bld: 86 mg/dL (ref 70–99)
POTASSIUM: 4.3 meq/L (ref 3.7–5.3)
SODIUM: 136 meq/L — AB (ref 137–147)
Total Protein: 6.7 g/dL (ref 6.0–8.3)

## 2013-10-29 LAB — MAGNESIUM: Magnesium: 1.8 mg/dL (ref 1.5–2.5)

## 2013-10-29 LAB — TSH: TSH: 0.956 u[IU]/mL (ref 0.350–4.500)

## 2013-10-29 LAB — PHOSPHORUS: PHOSPHORUS: 4.1 mg/dL (ref 2.3–4.6)

## 2013-10-29 NOTE — Consult Note (Addendum)
Admit date: 10/29/2013 Referring Physician  Dr. Kennon Rounds Primary Physician  Dr. Billey Chang Primary Cardiologist  None Reason for Consultation  Palpitations  HPI: This is a 33yo AAF with no prior cardiac history who is [redacted] weeks pregnant with her 3rd child.  She says that for the past week she has been having palpitations intermittently through the day.  She describes it as a tightness and then occasionally a rapid HR.  She denies any chest pressure.  She has had some SOB similar to the SOB she had with her prior pregnancies.  She has had some LE edema even when not pregnant.  She occasionally has some dizziness when she gets the palpitations.  She does admit to drinking 2 cups of caffienated tea and occasional mountain dew.  She has taken [place] which has caffiene in it and took it yesterday.     PMH:   Past Medical History  Diagnosis Date  . HSV-2 infection   . Fibroid   . Headache(784.0)   . Ovarian cyst   . Abnormal Pap smear     HPV  . Infection     UTI, chlamydia     PSH:   Past Surgical History  Procedure Laterality Date  . Cesarean section    . Tonsillectomy    . Adnoidectomy    . Myomectomy      Allergies:  Banana Prior to Admit Meds:   Prescriptions prior to admission  Medication Sig Dispense Refill  . acetaminophen (TYLENOL) 325 MG tablet Take 650 mg by mouth daily as needed for mild pain or moderate pain.      . butalbital-acetaminophen-caffeine (FIORICET) 50-325-40 MG per tablet Take 2 tablets by mouth every 6 (six) hours as needed for headache.  20 tablet  1  . Ca Carbonate-Mag Hydroxide (ROLAIDS PO) Take 1 tablet by mouth 3 (three) times daily as needed (heartburn).      . Prenatal Vit-Fe Fumarate-FA (PRENATAL MULTIVITAMIN) TABS tablet Take 1 tablet by mouth daily at 12 noon.      . SUMAtriptan (IMITREX) 50 MG tablet Take 1 tablet (50 mg total) by mouth every 2 (two) hours as needed for migraine or headache. May repeat in 2 hours if headache persists or  recurs. Di not take more than 200 mg (4 tablets in 24 hours)  20 tablet  2   Fam HX:    Family History  Problem Relation Age of Onset  . Hypertension Mother   . Diabetes Mother   . Diabetes Father   . Heart disease Father    Social HX:    History   Social History  . Marital Status: Married    Spouse Name: N/A    Number of Children: N/A  . Years of Education: N/A   Occupational History  . Not on file.   Social History Main Topics  . Smoking status: Current Some Day Smoker -- 0.25 packs/day for 12 years    Types: Cigarettes  . Smokeless tobacco: Never Used  . Alcohol Use: Yes     Comment: weekends; not with preg  . Drug Use: No  . Sexual Activity: Yes   Other Topics Concern  . Not on file   Social History Narrative  . No narrative on file     ROS:  All 11 ROS were addressed and are negative except what is stated in the HPI  Physical Exam: Blood pressure 122/77, pulse 80, temperature 98.3 F (36.8 C), temperature source Oral, resp. rate  18, last menstrual period 03/12/2013, SpO2 99.00%, unknown if currently breastfeeding.    General: Well developed, well nourished, in no acute distress Head: Eyes PERRLA, No xanthomas.   Normal cephalic and atramatic  Lungs:   Clear bilaterally to auscultation and percussion. Heart:   HRRR S1 S2 Pulses are 2+ & equal.            No carotid bruit. No JVD.  No abdominal bruits. No femoral bruits. Abdomen: Bowel sounds are positive, abdomen soft and non-tender without masses gravid uterus Extremities:   No clubbing, cyanosis or edema.  DP +1 Neuro: Alert and oriented X 3. Psych:  Good affect, responds appropriately    Labs:   Lab Results  Component Value Date   WBC 7.7 10/29/2013   HGB 11.4* 10/29/2013   HCT 34.3* 10/29/2013   MCV 84.9 10/29/2013   PLT 279 10/29/2013    Recent Labs Lab 10/29/13 0915  NA 136*  K 4.3  CL 103  CO2 22  BUN 6  CREATININE 0.50  CALCIUM 8.5  PROT 6.7  BILITOT <0.2*  ALKPHOS 249*  ALT  17  AST 25  GLUCOSE 86   No results found for this basename: PTT   No results found for this basename: INR, PROTIME   No results found for this basename: CKTOTAL, CKMB, CKMBINDEX, TROPONINI     No results found for this basename: CHOL   No results found for this basename: HDL   No results found for this basename: LDLCALC   No results found for this basename: TRIG   No results found for this basename: CHOLHDL   No results found for this basename: LDLDIRECT      Radiology:  US Ob Follow Up  10/29/2013   OBSTETRICAL ULTRASOUND: This exam was performed within a [facility]. The OB US report was generated in the AS system, and faxed to the ordering physician.   This report is available in the [facility]. See the AS Obstetric US report via the Image Link.   EKG:  NSR with bigeminal PVC's  ASSESSMENT:  1.  Palpitations with bigeminal PVC's on tele.  Initially consulted for bradycardia by pulse but EKG shows NSR with bigeminal PVC's which are not picked up on pulse.  Her HR on EKG is 82bpm. 2.  Bigeminal PVC's most likely triggered by caffeine intake.  Would also consider possible thyroid issues.  She is not very symptomatic with them.  Her magnesium and potassium are normal. 3.  SOB secondary to pregnancy - she had SOB with her prior pregnancies as well 4.  33 week pregnancy  PLAN:   1.  Check TSH 2.  Will get a 2D echo to assess LVF.  If LVF normal then ok to discharge home with followup with me in 1 week in the office 3.  I have instructed her to avoid caffienated products for now.  If her PVC's have not resolved by OV, may get a 24 Holter to assess PVC load.  Sueanne Margarita, MD  10/29/2013  12:41 PM

## 2013-10-29 NOTE — Progress Notes (Signed)
2D echo reviewed.  LV size and function are normal with no valvular abnormalities and no pericardial effusion.  Patient stable for discharge home from cardiac standpoint and needs followup with me in 1 week  Fransico Him, MD [facility] 10/29/2013

## 2013-10-29 NOTE — MAU Provider Note (Signed)
History     CSN: [id number]  Arrival date and time: 10/29/13 0858   None     No chief complaint on file.  HPI Patient is 33 y.o. [id number] [redacted]w[redacted]d here with complaints of found to be bradycardic down to 48 on monitor in clinic, referred for further evaluation.  +FM, denies LOF, VB, contractions, vaginal discharge.  Palpitations q2-3 days associated with shortness of breath, occurred prior to pregnancy but have become more frequent in pregnancy.  Occasionally also has dizziness not necessarily associated with activity/standing. - no chronic medications - father died from heart attack in 68s  Also notes frequent severe headache lasting 1-2 hours to half day.  Occasionally has light flash in right eye, no blurry vision.    Past Medical History  Diagnosis Date  . HSV-2 infection   . Fibroid   . Headache(784.0)   . Ovarian cyst   . Abnormal Pap smear     HPV  . Infection     UTI, chlamydia    Past Surgical History  Procedure Laterality Date  . Cesarean section    . Tonsillectomy    . Adnoidectomy    . Myomectomy      Family History  Problem Relation Age of Onset  . Hypertension Mother   . Diabetes Mother   . Diabetes Father   . Heart disease Father     History  Substance Use Topics  . Smoking status: Current Some Day Smoker -- 0.25 packs/day for 12 years    Types: Cigarettes  . Smokeless tobacco: Never Used  . Alcohol Use: Yes     Comment: weekends; not with preg    Allergies:  Allergies  Allergen Reactions  . Banana Itching    Prescriptions prior to admission  Medication Sig Dispense Refill  . acetaminophen (TYLENOL) 325 MG tablet Take 650 mg by mouth daily as needed for mild pain or moderate pain.      . butalbital-acetaminophen-caffeine (FIORICET) 50-325-40 MG per tablet Take 2 tablets by mouth every 6 (six) hours as needed for headache.  20 tablet  1  . Ca Carbonate-Mag Hydroxide (ROLAIDS PO) Take 1 tablet by mouth 3 (three) times daily as needed  (heartburn).      . Prenatal Vit-Fe Fumarate-FA (PRENATAL MULTIVITAMIN) TABS tablet Take 1 tablet by mouth daily at 12 noon.      . SUMAtriptan (IMITREX) 50 MG tablet Take 1 tablet (50 mg total) by mouth every 2 (two) hours as needed for migraine or headache. May repeat in 2 hours if headache persists or recurs. Di not take more than 200 mg (4 tablets in 24 hours)  20 tablet  2    Review of Systems  Constitutional: Negative for fever and chills.  Respiratory: Negative for cough and shortness of breath.   Cardiovascular: Positive for leg swelling. Negative for chest pain.  Gastrointestinal: Negative for heartburn, nausea, vomiting and diarrhea.  Genitourinary: Negative for dysuria, urgency, frequency and hematuria.  Neurological: Positive for dizziness.       +headache   Physical Exam   Blood pressure 122/77, pulse 76, temperature 98.3 F (36.8 C), temperature source Oral, resp. rate 18, last menstrual period 03/12/2013, unknown if currently breastfeeding.  Physical Exam  Constitutional: She is oriented to person, place, and time. She appears well-developed and well-nourished.  HENT:  Head: Normocephalic and atraumatic.  Eyes: Conjunctivae and EOM are normal.  Neck: Normal range of motion.  Cardiovascular:  Bradycardic  Respiratory: Effort normal. No respiratory distress.  GI:  Soft. She exhibits no distension. There is no tenderness.  Musculoskeletal: Normal range of motion. She exhibits edema (1+).  Neurological: She is alert and oriented to person, place, and time.  Skin: Skin is warm and dry. No erythema.    MAU Course  Procedures  MDM NST: reactive EKG: frequent PVCs   Labs: Results for orders placed during the hospital encounter of 10/29/13 (from the past 24 hour(s))  COMPREHENSIVE METABOLIC PANEL   Collection Time    10/29/13  9:15 AM      Result Value Ref Range   Sodium 136 (*) 137 - 147 mEq/L   Potassium 4.3  3.7 - 5.3 mEq/L   Chloride 103  96 - 112 mEq/L    CO2 22  19 - 32 mEq/L   Glucose, Bld 86  70 - 99 mg/dL   BUN 6  6 - 23 mg/dL   Creatinine, Ser 0.50  0.50 - 1.10 mg/dL   Calcium 8.5  8.4 - 10.5 mg/dL   Total Protein 6.7  6.0 - 8.3 g/dL   Albumin 2.6 (*) 3.5 - 5.2 g/dL   AST 25  0 - 37 U/L   ALT 17  0 - 35 U/L   Alkaline Phosphatase 249 (*) 39 - 117 U/L   Total Bilirubin <0.2 (*) 0.3 - 1.2 mg/dL   GFR calc non Af Amer >90  >90 mL/min   GFR calc Af Amer >90  >90 mL/min   Anion gap 11  5 - 15  CBC   Collection Time    10/29/13  9:15 AM      Result Value Ref Range   WBC 7.7  4.0 - 10.5 K/uL   RBC 4.04  3.87 - 5.11 MIL/uL   Hemoglobin 11.4 (*) 12.0 - 15.0 g/dL   HCT 34.3 (*) 36.0 - 46.0 %   MCV 84.9  78.0 - 100.0 fL   MCH 28.2  26.0 - 34.0 pg   MCHC 33.2  30.0 - 36.0 g/dL   RDW 14.0  11.5 - 15.5 %   Platelets 279  150 - 400 K/uL  MAGNESIUM   Collection Time    10/29/13  9:15 AM      Result Value Ref Range   Magnesium 1.8  1.5 - 2.5 mg/dL  PHOSPHORUS   Collection Time    10/29/13  9:15 AM      Result Value Ref Range   Phosphorus 4.1  2.3 - 4.6 mg/dL  TSH   Collection Time    10/29/13  9:15 AM      Result Value Ref Range   TSH 0.956  0.350 - 4.500 uIU/mL    Imaging Studies:  US Ob Follow Up  10/29/2013   OBSTETRICAL ULTRASOUND: This exam was performed within a [facility]. The OB US report was generated in the AS system, and faxed to the ordering physician.   This report is available in the [facility]. See the [facility] Obstetric US report via the Image Link.    Assessment and Plan   Cardiology consulted will see patient ==> PVCs likely secondary to caffeine intake, advised cessation/decrease, no fiorcet, ECHO to rule out structural abnormality.  Clinic follow up 1 week.  If symptomatic will consider beta blocker.   Erika Gonzales 10/29/2013, 10:24 AM

## 2013-10-29 NOTE — ED Notes (Signed)
Pt taken to MAU due to abnormal maternal heart rate and rhythm.

## 2013-10-29 NOTE — Discharge Instructions (Signed)
Bradycardia °Bradycardia is a term for a heart rate (pulse) that, in adults, is slower than 60 beats per minute. A normal rate is 60 to 100 beats per minute. A heart rate below 60 beats per minute may be normal for some adults with healthy hearts. If the rate is too slow, the heart may have trouble pumping the volume of blood the body needs. If the heart rate gets too low, blood flow to the brain may be decreased and may make you feel lightheaded, dizzy, or faint. °The heart has a natural pacemaker in the top of the heart called the SA node (sinoatrial or sinus node). This pacemaker sends out regular electrical signals to the muscle of the heart, telling the heart muscle when to beat (contract). The electrical signal travels from the upper parts of the heart (atria) through the AV node (atrioventricular node), to the lower chambers of the heart (ventricles). The ventricles squeeze, pumping the blood from your heart to your lungs and to the rest of your body. °CAUSES  °· Problem with the heart's electrical system. °· Problem with the heart's natural pacemaker. °· Heart disease, damage, or infection. °· Medications. °· Problems with minerals and salts (electrolytes). °SYMPTOMS  °· Fainting (syncope). °· Fatigue and weakness. °· Shortness of breath (dyspnea). °· Chest pain (angina). °· Drowsiness. °· Confusion. °DIAGNOSIS  °· An electrocardiogram (ECG) can help your caregiver determine the type of slow heart rate you have. °· If the cause is not seen on an ECG, you may need to wear a heart monitor that records your heart rhythm for several hours or days. °· Blood tests. °TREATMENT  °· Electrolyte supplements. °· Medications. °· Withholding medication which is causing a slow heart rate. °· Pacemaker placement. °SEEK IMMEDIATE MEDICAL CARE IF:  °· You feel lightheaded or faint. °· You develop an irregular heart rate. °· You feel chest pain or have trouble breathing. °MAKE SURE YOU:  °· Understand these  instructions. °· Will watch your condition. °· Will get help right away if you are not doing well or get worse. °Document Released: 09/23/2001 Document Revised: 03/26/2011 Document Reviewed: 04/08/2013 °Exit[employer]® Patient Information ©2015 [employer]. This information is not intended to replace advice given to you by your health care provider. Make sure you discuss any questions you have with your health care provider. ° °

## 2013-10-29 NOTE — ED Notes (Signed)
Maternal HR 48 on phillips monitor.  Took manual pulse and found to be 46.  Auscultated and heard very irregular maternal heart beat. Dr. Margurite Auerbach notified.

## 2013-10-29 NOTE — Progress Notes (Signed)
  Echocardiogram 2D Echocardiogram has been performed.  Erika Gonzales FRANCES 10/29/2013, 4:21 PM

## 2013-10-29 NOTE — MAU Note (Addendum)
Pt was at [facility]FM for [place].  Maternal pulse noted to be low, irregular, sent here for eval. States aware of kind of fluttery past couple days.  Low back ache, fell last night-tripped over dog.

## 2013-10-30 ENCOUNTER — Other Ambulatory Visit: Payer: Self-pay

## 2013-10-30 NOTE — MAU Provider Note (Signed)
`````  Attestation of Attending Supervision of Advanced Practitioner: Evaluation and management procedures were performed by the PA/NP/CNM/OB Fellow under my supervision/collaboration. Chart reviewed and agree with management and plan.  Owen Pratte V 10/30/2013 10:04 PM

## 2013-11-02 ENCOUNTER — Ambulatory Visit (INDEPENDENT_AMBULATORY_CARE_PROVIDER_SITE_OTHER): Payer: 59 | Admitting: Cardiology

## 2013-11-02 ENCOUNTER — Encounter: Payer: Self-pay | Admitting: Cardiology

## 2013-11-02 VITALS — BP 110/62 | HR 92 | Ht 66.0 in | Wt 210.0 lb

## 2013-11-02 DIAGNOSIS — R002 Palpitations: Secondary | ICD-10-CM

## 2013-11-02 DIAGNOSIS — I493 Ventricular premature depolarization: Secondary | ICD-10-CM

## 2013-11-02 NOTE — Patient Instructions (Signed)
Your physician recommends that you schedule a follow-up appointment in: 3 months with Dr. Radford Pax.  Your physician recommends that you continue on your current medications as directed. Please refer to the Current Medication list given to you today.

## 2013-11-02 NOTE — Progress Notes (Signed)
[place], [address] [place], [place]  [address] Phone: [phone] Fax:  [phone]  Date:  11/02/2013   ID:  Erika Gonzales, DOB Feb 25, 1980, MRN [id number]  PCP:  Leamon Arnt, MD  Cardiologist:  Forrestine Him, MD    History of Present Illness: This is a 33yo AAF with no prior cardiac history who is [redacted] weeks pregnant with her 3rd child. She says that for the past week she has been having palpitations intermittently through the day. She describes it as a tightness and then occasionally a rapid HR. She denies any chest pressure. She has had some SOB similar to the SOB she had with her prior pregnancies. She has had some LE edema even when not pregnant. She occasionally has some dizziness when she gets the palpitations. She does admit to drinking 2 cups of caffienated tea and occasional mountain dew. She has taken [place] which has caffiene in it and took it yesterday. She was seen in the ER last week and 2D echo was done which showed normal LVF with no valvular abnormalities.  Her EKG showed NSR with frequent PVC's.  She was instructed to stop all caffeine intake.  Her electrolytes were normal.  She now presents back for followup.  She has stopped the caffeine intake and is feeling better.  She will occasionally feel a skipped beat but they have decreased significantly.  She denies any chest pain.  She has chronic SOB from pregnancy.  She continues to have intermittent LE edema.    Wt Readings from Last 3 Encounters:  11/02/13 210 lb (95.255 kg)  10/29/13 208 lb 4 oz (94.462 kg)  10/20/13 209 lb (94.802 kg)     Past Medical History  Diagnosis Date  . HSV-2 infection   . Fibroid   . Headache(784.0)   . Ovarian cyst   . Abnormal Pap smear     HPV  . Infection     UTI, chlamydia    Current Outpatient Prescriptions  Medication Sig Dispense Refill  . acetaminophen (TYLENOL) 325 MG tablet Take 650 mg by mouth daily as needed for mild pain or moderate pain.      .  butalbital-acetaminophen-caffeine (FIORICET) 50-325-40 MG per tablet Take 2 tablets by mouth every 6 (six) hours as needed for headache.  20 tablet  1  . Ca Carbonate-Mag Hydroxide (ROLAIDS PO) Take 1 tablet by mouth 3 (three) times daily as needed (heartburn).      . Prenatal Vit-Fe Fumarate-FA (PRENATAL MULTIVITAMIN) TABS tablet Take 1 tablet by mouth daily at 12 noon.      . SUMAtriptan (IMITREX) 50 MG tablet Take 1 tablet (50 mg total) by mouth every 2 (two) hours as needed for migraine or headache. May repeat in 2 hours if headache persists or recurs. Di not take more than 200 mg (4 tablets in 24 hours)  20 tablet  2   No current facility-administered medications for this visit.    Allergies:    Allergies  Allergen Reactions  . Banana Itching    Social History:  The patient  reports that she has been smoking Cigarettes.  She has a 3 pack-year smoking history. She has never used smokeless tobacco. She reports that she drinks alcohol. She reports that she does not use illicit drugs.   Family History:  The patient's family history includes Diabetes in her father and mother; Heart disease in her father; Hypertension in her mother.   ROS:  Please see the history of present  illness.      All other systems reviewed and negative.   PHYSICAL EXAM: VS:  BP 110/62  Pulse 92  Ht 5\' 6"  (1.676 m)  Wt 210 lb (95.255 kg)  BMI 33.91 kg/m2  LMP 03/12/2013 Well nourished, well developed, in no acute distress HEENT: normal Neck: no JVD Cardiac:  normal S1, S2; RRR; no murmur Lungs:  clear to auscultation bilaterally, no wheezing, rhonchi or rales Abd: soft, nontender, no hepatomegaly Ext: no edema Skin: warm and dry Neuro:  CNs 2-12 intact, no focal abnormalities noted  EKG:  NSR with no ST changes  ASSESSMENT AND PLAN:  1. Symptomatic PVC's which have resolved off caffeine - I encouraged her to remain off caffiene.  2. 34 week pregnancy  Followup with me in 3 months  Signed, Fransico Him, MD [facility] 11/02/2013 3:25 PM

## 2013-11-04 ENCOUNTER — Ambulatory Visit (INDEPENDENT_AMBULATORY_CARE_PROVIDER_SITE_OTHER): Payer: 59 | Admitting: Advanced Practice Midwife

## 2013-11-04 VITALS — BP 132/65 | HR 88 | Temp 97.3°F | Wt 209.5 lb

## 2013-11-04 DIAGNOSIS — Z3483 Encounter for supervision of other normal pregnancy, third trimester: Secondary | ICD-10-CM

## 2013-11-04 LAB — POCT URINALYSIS DIP (DEVICE)
BILIRUBIN URINE: NEGATIVE
Glucose, UA: NEGATIVE mg/dL
Hgb urine dipstick: NEGATIVE
KETONES UR: NEGATIVE mg/dL
LEUKOCYTES UA: NEGATIVE
Nitrite: NEGATIVE
PH: 6.5 (ref 5.0–8.0)
PROTEIN: 30 mg/dL — AB
SPECIFIC GRAVITY, URINE: 1.025 (ref 1.005–1.030)
Urobilinogen, UA: 0.2 mg/dL (ref 0.0–1.0)

## 2013-11-04 NOTE — Progress Notes (Signed)
Swelling in ankles, feet, and hands Pain/pressure located in pelvic area.  Pain lower back and ligament  Pt reports having contractions starting two day ago x 2-3 a day

## 2013-11-04 NOTE — Patient Instructions (Signed)
Braxton Hicks Contractions Contractions of the uterus can occur throughout pregnancy. Contractions are not always a sign that you are in labor.  WHAT ARE BRAXTON HICKS CONTRACTIONS?  Contractions that occur before labor are called Braxton Hicks contractions, or false labor. Toward the end of pregnancy (32-34 weeks), these contractions can develop more often and may become more forceful. This is not true labor because these contractions do not result in opening (dilatation) and thinning of the cervix. They are sometimes difficult to tell apart from true labor because these contractions can be forceful and people have different pain tolerances. You should not feel embarrassed if you go to the hospital with false labor. Sometimes, the only way to tell if you are in true labor is for your health care provider to look for changes in the cervix. If there are no prenatal problems or other health problems associated with the pregnancy, it is completely safe to be sent home with false labor and await the onset of true labor. HOW CAN YOU TELL THE DIFFERENCE BETWEEN TRUE AND FALSE LABOR? False Labor  The contractions of false labor are usually shorter and not as hard as those of true labor.   The contractions are usually irregular.   The contractions are often felt in the front of the lower abdomen and in the groin.   The contractions may go away when you walk around or change positions while lying down.   The contractions get weaker and are shorter lasting as time goes on.   The contractions do not usually become progressively stronger, regular, and closer together as with true labor.  True Labor  Contractions in true labor last 30-70 seconds, become very regular, usually become more intense, and increase in frequency.   The contractions do not go away with walking.   The discomfort is usually felt in the top of the uterus and spreads to the lower abdomen and low back.   True labor can be  determined by your health care provider with an exam. This will show that the cervix is dilating and getting thinner.  WHAT TO REMEMBER  Keep up with your usual exercises and follow other instructions given by your health care provider.   Take medicines as directed by your health care provider.   Keep your regular prenatal appointments.   Eat and drink lightly if you think you are going into labor.   If Braxton Hicks contractions are making you uncomfortable:   Change your position from lying down or resting to walking, or from walking to resting.   Sit and rest in a tub of warm water.   Drink 2-3 glasses of water. Dehydration may cause these contractions.   Do slow and deep breathing several times an hour.  WHEN SHOULD I SEEK IMMEDIATE MEDICAL CARE? Seek immediate medical care if:  Your contractions become stronger, more regular, and closer together.   You have fluid leaking or gushing from your vagina.   You have a fever.   You pass blood-tinged mucus.   You have vaginal bleeding.   You have continuous abdominal pain.   You have low back pain that you never had before.   You feel your baby's head pushing down and causing pelvic pressure.   Your baby is not moving as much as it used to.  Document Released: 01/01/2005 Document Revised: 01/06/2013 Document Reviewed: 10/13/2012 Exit[employer] Patient Information 2015 [employer]. This information is not intended to replace advice given to you by your health care   provider. Make sure you discuss any questions you have with your health care provider.  Fetal Movement Counts Patient Name: __________________________________________________ Patient Due Date: ____________________ Performing a fetal movement count is highly recommended in high-risk pregnancies, but it is good for every pregnant woman to do. Your health care provider may ask you to start counting fetal movements at 28 weeks of the pregnancy. Fetal  movements often increase:  After eating a full meal.  After physical activity.  After eating or drinking something sweet or cold.  At rest. Pay attention to when you feel the baby is most active. This will help you notice a pattern of your baby's sleep and wake cycles and what factors contribute to an increase in fetal movement. It is important to perform a fetal movement count at the same time each day when your baby is normally most active.  HOW TO COUNT FETAL MOVEMENTS 1. Find a quiet and comfortable area to sit or lie down on your left side. Lying on your left side provides the best blood and oxygen circulation to your baby. 2. Write down the day and time on a sheet of paper or in a journal. 3. Start counting kicks, flutters, swishes, rolls, or jabs in a 2-hour period. You should feel at least 10 movements within 2 hours. 4. If you do not feel 10 movements in 2 hours, wait 2-3 hours and count again. Look for a change in the pattern or not enough counts in 2 hours. SEEK MEDICAL CARE IF:  You feel less than 10 counts in 2 hours, tried twice.  There is no movement in over an hour.  The pattern is changing or taking longer each day to reach 10 counts in 2 hours.  You feel the baby is not moving as he or she usually does. Date: ____________ Movements: ____________ Start time: ____________ Finish time: ____________  Date: ____________ Movements: ____________ Start time: ____________ Finish time: ____________ Date: ____________ Movements: ____________ Start time: ____________ Finish time: ____________ Date: ____________ Movements: ____________ Start time: ____________ Finish time: ____________ Date: ____________ Movements: ____________ Start time: ____________ Finish time: ____________ Date: ____________ Movements: ____________ Start time: ____________ Finish time: ____________ Date: ____________ Movements: ____________ Start time: ____________ Finish time: ____________ Date: ____________  Movements: ____________ Start time: ____________ Finish time: ____________  Date: ____________ Movements: ____________ Start time: ____________ Finish time: ____________ Date: ____________ Movements: ____________ Start time: ____________ Finish time: ____________ Date: ____________ Movements: ____________ Start time: ____________ Finish time: ____________ Date: ____________ Movements: ____________ Start time: ____________ Finish time: ____________ Date: ____________ Movements: ____________ Start time: ____________ Finish time: ____________ Date: ____________ Movements: ____________ Start time: ____________ Finish time: ____________ Date: ____________ Movements: ____________ Start time: ____________ Finish time: ____________  Date: ____________ Movements: ____________ Start time: ____________ Finish time: ____________ Date: ____________ Movements: ____________ Start time: ____________ Finish time: ____________ Date: ____________ Movements: ____________ Start time: ____________ Finish time: ____________ Date: ____________ Movements: ____________ Start time: ____________ Finish time: ____________ Date: ____________ Movements: ____________ Start time: ____________ Finish time: ____________ Date: ____________ Movements: ____________ Start time: ____________ Finish time: ____________ Date: ____________ Movements: ____________ Start time: ____________ Finish time: ____________  Date: ____________ Movements: ____________ Start time: ____________ Finish time: ____________ Date: ____________ Movements: ____________ Start time: ____________ Finish time: ____________ Date: ____________ Movements: ____________ Start time: ____________ Finish time: ____________ Date: ____________ Movements: ____________ Start time: ____________ Finish time: ____________ Date: ____________ Movements: ____________ Start time: ____________ Finish time: ____________ Date: ____________ Movements: ____________ Start time:  ____________ Finish time: ____________ Date: ____________ Movements:   ____________ Start time: ____________ Finish time: ____________  Date: ____________ Movements: ____________ Start time: ____________ Finish time: ____________ Date: ____________ Movements: ____________ Start time: ____________ Finish time: ____________ Date: ____________ Movements: ____________ Start time: ____________ Finish time: ____________ Date: ____________ Movements: ____________ Start time: ____________ Finish time: ____________ Date: ____________ Movements: ____________ Start time: ____________ Finish time: ____________ Date: ____________ Movements: ____________ Start time: ____________ Finish time: ____________ Date: ____________ Movements: ____________ Start time: ____________ Finish time: ____________  Date: ____________ Movements: ____________ Start time: ____________ Finish time: ____________ Date: ____________ Movements: ____________ Start time: ____________ Finish time: ____________ Date: ____________ Movements: ____________ Start time: ____________ Finish time: ____________ Date: ____________ Movements: ____________ Start time: ____________ Finish time: ____________ Date: ____________ Movements: ____________ Start time: ____________ Finish time: ____________ Date: ____________ Movements: ____________ Start time: ____________ Finish time: ____________ Date: ____________ Movements: ____________ Start time: ____________ Finish time: ____________  Date: ____________ Movements: ____________ Start time: ____________ Finish time: ____________ Date: ____________ Movements: ____________ Start time: ____________ Finish time: ____________ Date: ____________ Movements: ____________ Start time: ____________ Finish time: ____________ Date: ____________ Movements: ____________ Start time: ____________ Finish time: ____________ Date: ____________ Movements: ____________ Start time: ____________ Finish time: ____________ Date:  ____________ Movements: ____________ Start time: ____________ Finish time: ____________ Date: ____________ Movements: ____________ Start time: ____________ Finish time: ____________  Date: ____________ Movements: ____________ Start time: ____________ Finish time: ____________ Date: ____________ Movements: ____________ Start time: ____________ Finish time: ____________ Date: ____________ Movements: ____________ Start time: ____________ Finish time: ____________ Date: ____________ Movements: ____________ Start time: ____________ Finish time: ____________ Date: ____________ Movements: ____________ Start time: ____________ Finish time: ____________ Date: ____________ Movements: ____________ Start time: ____________ Finish time: ____________ Document Released: 01/31/2006 Document Revised: 05/18/2013 Document Reviewed: 10/29/2011 Exit[employer] Patient Information 2015 [employer]. This information is not intended to replace advice given to you by your health care provider. Make sure you discuss any questions you have with your health care provider.  

## 2013-11-04 NOTE — Progress Notes (Signed)
Tired, but doing well. Growth [place] scheduled for low PAPP-A.

## 2013-11-06 ENCOUNTER — Other Ambulatory Visit (HOSPITAL_COMMUNITY): Payer: Self-pay | Admitting: Obstetrics and Gynecology

## 2013-11-06 DIAGNOSIS — O289 Unspecified abnormal findings on antenatal screening of mother: Secondary | ICD-10-CM

## 2013-11-06 DIAGNOSIS — O09899 Supervision of other high risk pregnancies, unspecified trimester: Secondary | ICD-10-CM

## 2013-11-06 DIAGNOSIS — Z8751 Personal history of pre-term labor: Secondary | ICD-10-CM

## 2013-11-06 DIAGNOSIS — O280 Abnormal hematological finding on antenatal screening of mother: Secondary | ICD-10-CM

## 2013-11-06 DIAGNOSIS — F17210 Nicotine dependence, cigarettes, uncomplicated: Secondary | ICD-10-CM

## 2013-11-06 DIAGNOSIS — Z98891 History of uterine scar from previous surgery: Secondary | ICD-10-CM

## 2013-11-09 ENCOUNTER — Telehealth: Payer: Self-pay | Admitting: *Deleted

## 2013-11-09 NOTE — Telephone Encounter (Signed)
Pt called the clinic to add days to her FMLA.  Called patient and left a message that we filled out her FMLA paperwork for intermittent FMLA and she needs to notify her employer of individual dates.  If any further questions, please call the clinic.

## 2013-11-10 ENCOUNTER — Telehealth: Payer: Self-pay | Admitting: *Deleted

## 2013-11-10 NOTE — Telephone Encounter (Addendum)
Pt left message stating that her FMLA paperwork was completed and needs additional clarification. The intermittent leave portion needs to state the number of hours allowed. Please update and fax back. I returned pt's call anc left message to call back and state whether a detailed message/information can be left on her voice mail.  Pt needs to be advised that her FMLA forms were not completed to include intermittent FMLA- only 6 wks after delivery of baby. Need to determine what pt is asking for as she does not have any risk factors to prevent her from working per chart review.  61  Pt returned the calla nd stated that a detailed message can be left on her voice mail.  She will be on lunch break today from 1345-1445 and will answer her phone.

## 2013-11-11 ENCOUNTER — Encounter: Payer: Self-pay | Admitting: *Deleted

## 2013-11-16 ENCOUNTER — Encounter: Payer: Self-pay | Admitting: Cardiology

## 2013-11-18 ENCOUNTER — Encounter: Payer: Self-pay | Admitting: Obstetrics and Gynecology

## 2013-11-18 ENCOUNTER — Ambulatory Visit (INDEPENDENT_AMBULATORY_CARE_PROVIDER_SITE_OTHER): Payer: 59 | Admitting: Obstetrics and Gynecology

## 2013-11-18 VITALS — BP 130/72 | HR 80 | Temp 98.7°F | Wt 214.8 lb

## 2013-11-18 DIAGNOSIS — O3421 Maternal care for scar from previous cesarean delivery: Secondary | ICD-10-CM

## 2013-11-18 DIAGNOSIS — Z3483 Encounter for supervision of other normal pregnancy, third trimester: Secondary | ICD-10-CM

## 2013-11-18 DIAGNOSIS — O34219 Maternal care for unspecified type scar from previous cesarean delivery: Secondary | ICD-10-CM

## 2013-11-18 LAB — POCT URINALYSIS DIP (DEVICE)
BILIRUBIN URINE: NEGATIVE
GLUCOSE, UA: NEGATIVE mg/dL
Hgb urine dipstick: NEGATIVE
KETONES UR: NEGATIVE mg/dL
NITRITE: NEGATIVE
PH: 7 (ref 5.0–8.0)
Protein, ur: NEGATIVE mg/dL
Specific Gravity, Urine: >=1.03 (ref 1.005–1.030)
Urobilinogen, UA: 0.2 mg/dL (ref 0.0–1.0)

## 2013-11-18 NOTE — Progress Notes (Signed)
Reports intermittent pelvic and lower back pain and occasional contractions.  GBS and cultures today.

## 2013-11-18 NOTE — Progress Notes (Signed)
For rpt C/S at 39wks. F/U US 11/26/13. Delivered elsewhere, no preference for surgeon. (Moved here after [place]) Still working. Plans BTL (Has [employer]). Had nl EKG and had cards consult for symptomatic PVCs that resolved. No cardio-resp sx at present. Cultures done.

## 2013-11-18 NOTE — Telephone Encounter (Signed)
Patient stopped by clinic today. Candace and I assisted her with FMLA paperwork. Forms corrected and faxed to work.

## 2013-11-18 NOTE — Patient Instructions (Signed)
Third Trimester of Pregnancy The third trimester is from week 29 through week 42, months 7 through 9. The third trimester is a time when the fetus is growing rapidly. At the end of the ninth month, the fetus is about 20 inches in length and weighs 6-10 pounds.  BODY CHANGES Your body goes through many changes during pregnancy. The changes vary from woman to woman.   Your weight will continue to increase. You can expect to gain 25-35 pounds (11-16 kg) by the end of the pregnancy.  You may begin to get stretch marks on your hips, abdomen, and breasts.  You may urinate more often because the fetus is moving lower into your pelvis and pressing on your bladder.  You may develop or continue to have heartburn as a result of your pregnancy.  You may develop constipation because certain hormones are causing the muscles that push waste through your intestines to slow down.  You may develop hemorrhoids or swollen, bulging veins (varicose veins).  You may have pelvic pain because of the weight gain and pregnancy hormones relaxing your joints between the bones in your pelvis. Backaches may result from overexertion of the muscles supporting your posture.  You may have changes in your hair. These can include thickening of your hair, rapid growth, and changes in texture. Some women also have hair loss during or after pregnancy, or hair that feels dry or thin. Your hair will most likely return to normal after your baby is born.  Your breasts will continue to grow and be tender. A yellow discharge may leak from your breasts called colostrum.  Your belly button may stick out.  You may feel short of breath because of your expanding uterus.  You may notice the fetus "dropping," or moving lower in your abdomen.  You may have a bloody mucus discharge. This usually occurs a few days to a week before labor begins.  Your cervix becomes thin and soft (effaced) near your due date. WHAT TO EXPECT AT YOUR PRENATAL  EXAMS  You will have prenatal exams every 2 weeks until week 36. Then, you will have weekly prenatal exams. During a routine prenatal visit:  You will be weighed to make sure you and the fetus are growing normally.  Your blood pressure is taken.  Your abdomen will be measured to track your baby's growth.  The fetal heartbeat will be listened to.  Any test results from the previous visit will be discussed.  You may have a cervical check near your due date to see if you have effaced. At around 36 weeks, your caregiver will check your cervix. At the same time, your caregiver will also perform a test on the secretions of the vaginal tissue. This test is to determine if a type of bacteria, Group B streptococcus, is present. Your caregiver will explain this further. Your caregiver may ask you:  What your birth plan is.  How you are feeling.  If you are feeling the baby move.  If you have had any abnormal symptoms, such as leaking fluid, bleeding, severe headaches, or abdominal cramping.  If you have any questions. Other tests or screenings that may be performed during your third trimester include:  Blood tests that check for low iron levels (anemia).  Fetal testing to check the health, activity level, and growth of the fetus. Testing is done if you have certain medical conditions or if there are problems during the pregnancy. FALSE LABOR You may feel small, irregular contractions that   eventually go away. These are called Braxton Hicks contractions, or false labor. Contractions may last for hours, days, or even weeks before true labor sets in. If contractions come at regular intervals, intensify, or become painful, it is best to be seen by your caregiver.  SIGNS OF LABOR   Menstrual-like cramps.  Contractions that are 5 minutes apart or less.  Contractions that start on the top of the uterus and spread down to the lower abdomen and back.  A sense of increased pelvic pressure or back  pain.  A watery or bloody mucus discharge that comes from the vagina. If you have any of these signs before the 37th week of pregnancy, call your caregiver right away. You need to go to the hospital to get checked immediately. HOME CARE INSTRUCTIONS   Avoid all smoking, herbs, alcohol, and unprescribed drugs. These chemicals affect the formation and growth of the baby.  Follow your caregiver's instructions regarding medicine use. There are medicines that are either safe or unsafe to take during pregnancy.  Exercise only as directed by your caregiver. Experiencing uterine cramps is a good sign to stop exercising.  Continue to eat regular, healthy meals.  Wear a good support bra for breast tenderness.  Do not use hot tubs, steam rooms, or saunas.  Wear your seat belt at all times when driving.  Avoid raw meat, uncooked cheese, cat litter boxes, and soil used by cats. These carry germs that can cause birth defects in the baby.  Take your prenatal vitamins.  Try taking a stool softener (if your caregiver approves) if you develop constipation. Eat more high-fiber foods, such as fresh vegetables or fruit and whole grains. Drink plenty of fluids to keep your urine clear or pale yellow.  Take warm sitz baths to soothe any pain or discomfort caused by hemorrhoids. Use hemorrhoid cream if your caregiver approves.  If you develop varicose veins, wear support hose. Elevate your feet for 15 minutes, 3-4 times a day. Limit salt in your diet.  Avoid heavy lifting, wear low heal shoes, and practice good posture.  Rest a lot with your legs elevated if you have leg cramps or low back pain.  Visit your dentist if you have not gone during your pregnancy. Use a soft toothbrush to brush your teeth and be gentle when you floss.  A sexual relationship may be continued unless your caregiver directs you otherwise.  Do not travel far distances unless it is absolutely necessary and only with the approval  of your caregiver.  Take prenatal classes to understand, practice, and ask questions about the labor and delivery.  Make a trial run to the hospital.  Pack your hospital bag.  Prepare the baby's nursery.  Continue to go to all your prenatal visits as directed by your caregiver. SEEK MEDICAL CARE IF:  You are unsure if you are in labor or if your water has broken.  You have dizziness.  You have mild pelvic cramps, pelvic pressure, or nagging pain in your abdominal area.  You have persistent nausea, vomiting, or diarrhea.  You have a bad smelling vaginal discharge.  You have pain with urination. SEEK IMMEDIATE MEDICAL CARE IF:   You have a fever.  You are leaking fluid from your vagina.  You have spotting or bleeding from your vagina.  You have severe abdominal cramping or pain.  You have rapid weight loss or gain.  You have shortness of breath with chest pain.  You notice sudden or extreme swelling   of your face, hands, ankles, feet, or legs.  You have not felt your baby move in over an hour.  You have severe headaches that do not go away with medicine.  You have vision changes. Document Released: 12/26/2000 Document Revised: 01/06/2013 Document Reviewed: 03/04/2012 [employer] Patient Information 2015 [employer]. This information is not intended to replace advice given to you by your health care provider. Make sure you discuss any questions you have with your health care provider.  

## 2013-11-19 LAB — GC/CHLAMYDIA PROBE AMP
CT Probe RNA: NEGATIVE
GC Probe RNA: NEGATIVE

## 2013-11-20 LAB — CULTURE, BETA STREP (GROUP B ONLY)

## 2013-11-24 ENCOUNTER — Ambulatory Visit (INDEPENDENT_AMBULATORY_CARE_PROVIDER_SITE_OTHER): Payer: 59 | Admitting: Obstetrics & Gynecology

## 2013-11-24 ENCOUNTER — Encounter: Payer: Self-pay | Admitting: Obstetrics & Gynecology

## 2013-11-24 VITALS — BP 137/77 | HR 77 | Temp 98.6°F | Wt 214.6 lb

## 2013-11-24 DIAGNOSIS — Z3483 Encounter for supervision of other normal pregnancy, third trimester: Secondary | ICD-10-CM

## 2013-11-24 LAB — POCT URINALYSIS DIP (DEVICE)
BILIRUBIN URINE: NEGATIVE
GLUCOSE, UA: NEGATIVE mg/dL
HGB URINE DIPSTICK: NEGATIVE
Ketones, ur: 40 mg/dL — AB
LEUKOCYTES UA: NEGATIVE
NITRITE: NEGATIVE
PH: 6 (ref 5.0–8.0)
Protein, ur: 30 mg/dL — AB
Specific Gravity, Urine: 1.025 (ref 1.005–1.030)
Urobilinogen, UA: 0.2 mg/dL (ref 0.0–1.0)

## 2013-11-24 NOTE — Progress Notes (Signed)
Routine visit. Good FM. I sent [place] an email to schedule RLTCS and BTL at 39 weeks.

## 2013-11-24 NOTE — Progress Notes (Signed)
C/o of intermittent pelvic pressure and occasional contractions.  Edema in hands and feet.

## 2013-11-26 ENCOUNTER — Encounter (HOSPITAL_COMMUNITY): Payer: Self-pay

## 2013-11-26 ENCOUNTER — Encounter (HOSPITAL_COMMUNITY): Payer: Self-pay | Admitting: *Deleted

## 2013-11-26 ENCOUNTER — Telehealth: Payer: Self-pay | Admitting: *Deleted

## 2013-11-26 ENCOUNTER — Inpatient Hospital Stay (HOSPITAL_COMMUNITY)
Admission: AD | Admit: 2013-11-26 | Discharge: 2013-11-26 | Disposition: A | Discharge status: Home / Self Care | Payer: 59 | Source: Ambulatory Visit | Attending: Obstetrics & Gynecology | Admitting: Obstetrics & Gynecology

## 2013-11-26 ENCOUNTER — Ambulatory Visit (HOSPITAL_COMMUNITY)
Admission: RE | Admit: 2013-11-26 | Discharge: 2013-11-26 | Disposition: A | Discharge status: Home / Self Care | Payer: 59 | Source: Ambulatory Visit | Attending: Advanced Practice Midwife | Admitting: Advanced Practice Midwife

## 2013-11-26 DIAGNOSIS — O289 Unspecified abnormal findings on antenatal screening of mother: Secondary | ICD-10-CM | POA: Diagnosis present

## 2013-11-26 DIAGNOSIS — O133 Gestational [pregnancy-induced] hypertension without significant proteinuria, third trimester: Secondary | ICD-10-CM | POA: Diagnosis not present

## 2013-11-26 DIAGNOSIS — F17210 Nicotine dependence, cigarettes, uncomplicated: Secondary | ICD-10-CM | POA: Insufficient documentation

## 2013-11-26 DIAGNOSIS — Z9889 Other specified postprocedural states: Secondary | ICD-10-CM | POA: Insufficient documentation

## 2013-11-26 DIAGNOSIS — Z98891 History of uterine scar from previous surgery: Secondary | ICD-10-CM

## 2013-11-26 DIAGNOSIS — O99333 Smoking (tobacco) complicating pregnancy, third trimester: Secondary | ICD-10-CM | POA: Insufficient documentation

## 2013-11-26 DIAGNOSIS — Z3A37 37 weeks gestation of pregnancy: Secondary | ICD-10-CM | POA: Diagnosis not present

## 2013-11-26 DIAGNOSIS — O280 Abnormal hematological finding on antenatal screening of mother: Secondary | ICD-10-CM

## 2013-11-26 DIAGNOSIS — Z8751 Personal history of pre-term labor: Secondary | ICD-10-CM | POA: Insufficient documentation

## 2013-11-26 DIAGNOSIS — O09899 Supervision of other high risk pregnancies, unspecified trimester: Secondary | ICD-10-CM | POA: Diagnosis present

## 2013-11-26 DIAGNOSIS — Z720 Tobacco use: Secondary | ICD-10-CM | POA: Insufficient documentation

## 2013-11-26 LAB — CBC
HCT: 35.7 % — ABNORMAL LOW (ref 36.0–46.0)
HEMOGLOBIN: 11.7 g/dL — AB (ref 12.0–15.0)
MCH: 27.9 pg (ref 26.0–34.0)
MCHC: 32.8 g/dL (ref 30.0–36.0)
MCV: 85.2 fL (ref 78.0–100.0)
PLATELETS: 269 10*3/uL (ref 150–400)
RBC: 4.19 MIL/uL (ref 3.87–5.11)
RDW: 14.6 % (ref 11.5–15.5)
WBC: 8.6 10*3/uL (ref 4.0–10.5)

## 2013-11-26 LAB — PROTEIN / CREATININE RATIO, URINE
Creatinine, Urine: 335.05 mg/dL
Protein Creatinine Ratio: 0.13 (ref 0.00–0.15)
Total Protein, Urine: 43 mg/dL

## 2013-11-26 LAB — COMPREHENSIVE METABOLIC PANEL
ALK PHOS: 361 U/L — AB (ref 39–117)
ALT: 16 U/L (ref 0–35)
AST: 17 U/L (ref 0–37)
Albumin: 2.5 g/dL — ABNORMAL LOW (ref 3.5–5.2)
Anion gap: 16 — ABNORMAL HIGH (ref 5–15)
BUN: 9 mg/dL (ref 6–23)
CHLORIDE: 101 meq/L (ref 96–112)
CO2: 21 meq/L (ref 19–32)
Calcium: 8.7 mg/dL (ref 8.4–10.5)
Creatinine, Ser: 0.57 mg/dL (ref 0.50–1.10)
GFR calc Af Amer: >90 mL/min (ref >90)
GFR calc non Af Amer: >90 mL/min (ref >90)
GLUCOSE: 105 mg/dL — AB (ref 70–99)
POTASSIUM: 3.7 meq/L (ref 3.7–5.3)
Sodium: 138 mEq/L (ref 137–147)
Total Bilirubin: <0.2 mg/dL — ABNORMAL LOW (ref 0.3–1.2)
Total Protein: 6.5 g/dL (ref 6.0–8.3)

## 2013-11-26 LAB — URINALYSIS, ROUTINE W REFLEX MICROSCOPIC
Bilirubin Urine: NEGATIVE
Glucose, UA: NEGATIVE mg/dL
Hgb urine dipstick: NEGATIVE
Ketones, ur: 15 mg/dL — AB
Leukocytes, UA: NEGATIVE
NITRITE: NEGATIVE
Protein, ur: 30 mg/dL — AB
Specific Gravity, Urine: >1.03 — ABNORMAL HIGH (ref 1.005–1.030)
UROBILINOGEN UA: 0.2 mg/dL (ref 0.0–1.0)
pH: 6 (ref 5.0–8.0)

## 2013-11-26 LAB — URINE MICROSCOPIC-ADD ON

## 2013-11-26 NOTE — MAU Provider Note (Signed)
Chief Complaint:  Hypertension   First Provider Initiated Contact with Patient 12/02/2013 at 1616.  HPI: Erika Gonzales is a 33 y.o. [id number] at [redacted]w[redacted]d who was sent to maternity admissions to [facility] for evaluation of elevated BP noted in [place] today. No prior elevated BPs. Ha yesterday that resolved spontaneously none now. Blurry vision today that resolved. None now.  Denies epigastric pain, contractions, leakage of fluid or vaginal bleeding. Good fetal movement.   Pregnancy Course: Previous C/S. Plans repeat.   Past Medical History: Past Medical History  Diagnosis Date  . HSV-2 infection   . Fibroid   . Headache(784.0)   . Ovarian cyst   . Abnormal Pap smear     HPV  . Infection     UTI, chlamydia    Past obstetric history: OB History  Gravida Para Term Preterm AB SAB TAB Ectopic Multiple Living  5 2 1 1 2 2  0 0 0 2    # Outcome Date GA Lbr Len/2nd Weight Sex Delivery Anes PTL Lv  5 Current           4 Preterm 11/09/10 [redacted]w[redacted]d  3.374 kg (7 lb 7 oz) M CS-Unspec   Y     Comments: elevated BP   3 Term 06/07/00 [redacted]w[redacted]d  2.722 kg (6 lb) F CS-LTranv  N Y     Comments: distress; emergency c-section  2 SAB           1 SAB               Past Surgical History: Past Surgical History  Procedure Laterality Date  . Cesarean section    . Tonsillectomy    . Adnoidectomy    . Myomectomy       Family History: Family History  Problem Relation Age of Onset  . Hypertension Mother   . Diabetes Mother   . Diabetes Father   . Heart disease Father     Social History: History  Substance Use Topics  . Smoking status: Current Some Day Smoker -- 0.25 packs/day for 12 years    Types: Cigarettes  . Smokeless tobacco: Never Used  . Alcohol Use: Yes     Comment: weekends; not with preg    Allergies:  Allergies  Allergen Reactions  . Banana Itching    Meds:  Prescriptions prior to admission  Medication Sig Dispense Refill Last Dose  . acetaminophen (TYLENOL) 325 MG tablet Take 650  mg by mouth daily as needed for mild pain or moderate pain.   Past Week at Unknown time  . Ca Carbonate-Mag Hydroxide (ROLAIDS PO) Take 1 tablet by mouth 3 (three) times daily as needed (heartburn).   11/25/2013 at Unknown time  . Prenatal Vit-Fe Fumarate-FA (PRENATAL MULTIVITAMIN) TABS tablet Take 1 tablet by mouth daily at 12 noon.   11/25/2013 at Unknown time  . SUMAtriptan (IMITREX) 50 MG tablet Take 1 tablet (50 mg total) by mouth every 2 (two) hours as needed for migraine or headache. May repeat in 2 hours if headache persists or recurs. Di not take more than 200 mg (4 tablets in 24 hours) 20 tablet 2 11/25/2013 at Unknown time  . butalbital-acetaminophen-caffeine (FIORICET) 50-325-40 MG per tablet Take 2 tablets by mouth every 6 (six) hours as needed for headache. (Patient not taking: Reported on 11/26/2013) 20 tablet 1 Not Taking    ROS: Pertinent findings in history of present illness.  Physical Exam  Blood pressure 134/76, pulse 81, temperature 98.1 F (36.7 C), temperature  source Oral, resp. rate 18, last menstrual period 03/12/2013, unknown if currently breastfeeding. GENERAL: Well-developed, well-nourished female in no acute distress.  HEENT: normocephalic HEART: normal rate RESP: normal effort ABDOMEN: Soft, non-tender, gravid appropriate for gestational age EXTREMITIES: Nontender, 1+ edema NEURO: alert and oriented. DTR's 2+, no clonus SPECULUM EXAM: Deferred     FHT:  Baseline 140 , moderate variability, accelerations present, no decelerations Contractions: rare, mild   Labs: Results for orders placed or performed during the hospital encounter of 11/26/13 (from the past 24 hour(s))  Urinalysis, Routine w reflex microscopic     Status: Abnormal   Collection Time: 11/26/13 10:55 AM  Result Value Ref Range   Color, Urine YELLOW YELLOW   APPearance CLEAR CLEAR   Specific Gravity, Urine >1.030 (H) 1.005 - 1.030   pH 6.0 5.0 - 8.0   Glucose, UA NEGATIVE NEGATIVE mg/dL    Hgb urine dipstick NEGATIVE NEGATIVE   Bilirubin Urine NEGATIVE NEGATIVE   Ketones, ur 15 (A) NEGATIVE mg/dL   Protein, ur 30 (A) NEGATIVE mg/dL   Urobilinogen, UA 0.2 0.0 - 1.0 mg/dL   Nitrite NEGATIVE NEGATIVE   Leukocytes, UA NEGATIVE NEGATIVE  Protein / creatinine ratio, urine     Status: None   Collection Time: 11/26/13 10:55 AM  Result Value Ref Range   Creatinine, Urine 335.05 mg/dL   Total Protein, Urine 43 mg/dL   Protein Creatinine Ratio 0.13 0.00 - 0.15  Urine microscopic-add on     Status: Abnormal   Collection Time: 11/26/13 10:55 AM  Result Value Ref Range   Squamous Epithelial / LPF FEW (A) RARE   WBC, UA 0-2 <3 WBC/hpf   Urine-Other MUCOUS PRESENT   CBC     Status: Abnormal   Collection Time: 11/26/13 11:10 AM  Result Value Ref Range   WBC 8.6 4.0 - 10.5 K/uL   RBC 4.19 3.87 - 5.11 MIL/uL   Hemoglobin 11.7 (L) 12.0 - 15.0 g/dL   HCT 35.7 (L) 36.0 - 46.0 %   MCV 85.2 78.0 - 100.0 fL   MCH 27.9 26.0 - 34.0 pg   MCHC 32.8 30.0 - 36.0 g/dL   RDW 14.6 11.5 - 15.5 %   Platelets 269 150 - 400 K/uL  Comprehensive metabolic panel     Status: Abnormal   Collection Time: 11/26/13 11:10 AM  Result Value Ref Range   Sodium 138 137 - 147 mEq/L   Potassium 3.7 3.7 - 5.3 mEq/L   Chloride 101 96 - 112 mEq/L   CO2 21 19 - 32 mEq/L   Glucose, Bld 105 (H) 70 - 99 mg/dL   BUN 9 6 - 23 mg/dL   Creatinine, Ser 0.57 0.50 - 1.10 mg/dL   Calcium 8.7 8.4 - 10.5 mg/dL   Total Protein 6.5 6.0 - 8.3 g/dL   Albumin 2.5 (L) 3.5 - 5.2 g/dL   AST 17 0 - 37 U/L   ALT 16 0 - 35 U/L   Alkaline Phosphatase 361 (H) 39 - 117 U/L   Total Bilirubin <0.2 (L) 0.3 - 1.2 mg/dL   GFR calc non Af Amer >90 >90 mL/min   GFR calc Af Amer >90 >90 mL/min   Anion gap 16 (H) 5 - 15    Imaging:   MAU Course:  Assessment: 1. Transient hypertension of pregnancy in third trimester    Plan: Discharge home in stable condition per consult w/ Dr. Ihor Dow. Pre-E precautions.  Labor precautions  and fetal kick counts   Follow-up  Information    Follow up with [facility] On 11/30/2013.   Specialty:  Obstetrics and Gynecology   Why:  As scheduled   Contact information:   [place] [phone]      Follow up with [place].   Why:  As needed if symptoms worsen   Contact information:   [address] [id number] [place] [phone]        Medication List    ASK your doctor about these medications        acetaminophen 325 MG tablet  Commonly known as:  TYLENOL  Take 650 mg by mouth daily as needed for mild pain or moderate pain.     butalbital-acetaminophen-caffeine 50-325-40 MG per tablet  Commonly known as:  FIORICET  Take 2 tablets by mouth every 6 (six) hours as needed for headache.     prenatal multivitamin Tabs tablet  Take 1 tablet by mouth daily at 12 noon.     ROLAIDS PO  Take 1 tablet by mouth 3 (three) times daily as needed (heartburn).     SUMAtriptan 50 MG tablet  Commonly known as:  IMITREX  Take 1 tablet (50 mg total) by mouth every 2 (two) hours as needed for migraine or headache. May repeat in 2 hours if headache persists or recurs. Di not take more than 200 mg (4 tablets in 24 hours)        Manya Silvas, CNM 11/26/2013 4:16 PM

## 2013-11-26 NOTE — Discharge Instructions (Signed)

## 2013-11-26 NOTE — Telephone Encounter (Signed)
Pt left message @ [phone] stating that she had [place] today and her BP was elevated x2 readings. She states that she was told to call our clinic to see if she needed an appt sooner than scheduled on 11/16 because of her abnormal BP. She also reports having H/A and nausea. I consulted with Dr. Hulan Fray and then called the pt @ [phone]. I advised her that she needs further evaluation of her elevated BP @ [facility]U today. Pt voiced understanding and agreed to go to [facility]U for evaluation.

## 2013-11-26 NOTE — MAU Note (Signed)
BP elevated at [place] today. HA since yesteray.  Reports visual blurring. Denies  Epigastric pain or swelling

## 2013-11-30 ENCOUNTER — Ambulatory Visit (INDEPENDENT_AMBULATORY_CARE_PROVIDER_SITE_OTHER): Payer: 59 | Admitting: Obstetrics and Gynecology

## 2013-11-30 VITALS — BP 139/80 | HR 87 | Temp 97.8°F | Wt 207.6 lb

## 2013-11-30 DIAGNOSIS — R030 Elevated blood-pressure reading, without diagnosis of hypertension: Secondary | ICD-10-CM

## 2013-11-30 DIAGNOSIS — IMO0001 Reserved for inherently not codable concepts without codable children: Secondary | ICD-10-CM

## 2013-11-30 LAB — POCT URINALYSIS DIP (DEVICE)
Bilirubin Urine: NEGATIVE
GLUCOSE, UA: NEGATIVE mg/dL
Hgb urine dipstick: NEGATIVE
KETONES UR: NEGATIVE mg/dL
Leukocytes, UA: NEGATIVE
Nitrite: NEGATIVE
PROTEIN: NEGATIVE mg/dL
Specific Gravity, Urine: >=1.03 (ref 1.005–1.030)
Urobilinogen, UA: 0.2 mg/dL (ref 0.0–1.0)
pH: 6 (ref 5.0–8.0)

## 2013-11-30 NOTE — Progress Notes (Signed)
C-section schedule for 11/27 with BTL.   States that she has had worsening pain and pressure with occasional contractions since last PM. Originally 10 - 15 minutes apart, but none for > 1 hour.  Only vomited x 2, endorses good appetite.   #) Elevated BP - Recently seen in [facility]U given elevated BP 145/82 in [place] w/ HA and blurry vision. Monitored all day with BP < 140/80 and with normal HELLP and UP:C of 0.13.  - continues with HAs, improve with tylenol. Endorses HA today, but no scotoma, RUQ pain, but with some occasional nausea in the past week. - BP originally 139/80 --> recheck 132/71 - pre-eclampsia precuations discussed  + FM, no VB, no LOF

## 2013-11-30 NOTE — Progress Notes (Signed)
Reports pelvic pressure and pain as well as hip and lower back pain. 7lb weight loss since last visit?

## 2013-12-04 ENCOUNTER — Ambulatory Visit (INDEPENDENT_AMBULATORY_CARE_PROVIDER_SITE_OTHER): Payer: 59

## 2013-12-04 VITALS — BP 134/72 | HR 72 | Wt 210.8 lb

## 2013-12-04 DIAGNOSIS — Z113 Encounter for screening for infections with a predominantly sexual mode of transmission: Secondary | ICD-10-CM

## 2013-12-04 DIAGNOSIS — Z118 Encounter for screening for other infectious and parasitic diseases: Secondary | ICD-10-CM

## 2013-12-04 DIAGNOSIS — Z0130 Encounter for examination of blood pressure without abnormal findings: Secondary | ICD-10-CM

## 2013-12-04 DIAGNOSIS — Z124 Encounter for screening for malignant neoplasm of cervix: Secondary | ICD-10-CM

## 2013-12-07 ENCOUNTER — Telehealth: Payer: Self-pay | Admitting: General Practice

## 2013-12-07 ENCOUNTER — Ambulatory Visit (INDEPENDENT_AMBULATORY_CARE_PROVIDER_SITE_OTHER): Payer: 59 | Admitting: Obstetrics and Gynecology

## 2013-12-07 ENCOUNTER — Inpatient Hospital Stay (HOSPITAL_COMMUNITY)
Admission: AD | Admit: 2013-12-07 | Discharge: 2013-12-07 | Disposition: A | Discharge status: Home / Self Care | Payer: 59 | Source: Ambulatory Visit | Attending: Family Medicine | Admitting: Family Medicine

## 2013-12-07 ENCOUNTER — Encounter (HOSPITAL_COMMUNITY): Payer: Self-pay | Admitting: *Deleted

## 2013-12-07 VITALS — BP 136/66 | HR 46 | Temp 98.1°F | Wt 208.6 lb

## 2013-12-07 DIAGNOSIS — Z3A38 38 weeks gestation of pregnancy: Secondary | ICD-10-CM | POA: Insufficient documentation

## 2013-12-07 DIAGNOSIS — R030 Elevated blood-pressure reading, without diagnosis of hypertension: Secondary | ICD-10-CM

## 2013-12-07 DIAGNOSIS — O26893 Other specified pregnancy related conditions, third trimester: Secondary | ICD-10-CM | POA: Diagnosis not present

## 2013-12-07 DIAGNOSIS — Z720 Tobacco use: Secondary | ICD-10-CM | POA: Insufficient documentation

## 2013-12-07 DIAGNOSIS — IMO0001 Reserved for inherently not codable concepts without codable children: Secondary | ICD-10-CM

## 2013-12-07 DIAGNOSIS — O133 Gestational [pregnancy-induced] hypertension without significant proteinuria, third trimester: Secondary | ICD-10-CM

## 2013-12-07 DIAGNOSIS — Z3483 Encounter for supervision of other normal pregnancy, third trimester: Secondary | ICD-10-CM

## 2013-12-07 LAB — URINALYSIS, ROUTINE W REFLEX MICROSCOPIC
Bilirubin Urine: NEGATIVE
Glucose, UA: NEGATIVE mg/dL
Hgb urine dipstick: NEGATIVE
Ketones, ur: NEGATIVE mg/dL
LEUKOCYTES UA: NEGATIVE
Nitrite: NEGATIVE
PH: 6.5 (ref 5.0–8.0)
Protein, ur: NEGATIVE mg/dL
SPECIFIC GRAVITY, URINE: 1.025 (ref 1.005–1.030)
Urobilinogen, UA: 0.2 mg/dL (ref 0.0–1.0)

## 2013-12-07 LAB — COMPREHENSIVE METABOLIC PANEL
ALT: 14 U/L (ref 0–35)
AST: 12 U/L (ref 0–37)
Albumin: 2.3 g/dL — ABNORMAL LOW (ref 3.5–5.2)
Alkaline Phosphatase: 431 U/L — ABNORMAL HIGH (ref 39–117)
Anion gap: 10 (ref 5–15)
BUN: 9 mg/dL (ref 6–23)
CHLORIDE: 101 meq/L (ref 96–112)
CO2: 24 meq/L (ref 19–32)
Calcium: 8.8 mg/dL (ref 8.4–10.5)
Creatinine, Ser: 0.71 mg/dL (ref 0.50–1.10)
GFR calc Af Amer: >90 mL/min (ref >90)
Glucose, Bld: 97 mg/dL (ref 70–99)
Potassium: 4.2 mEq/L (ref 3.7–5.3)
SODIUM: 135 meq/L — AB (ref 137–147)
Total Protein: 5.8 g/dL — ABNORMAL LOW (ref 6.0–8.3)

## 2013-12-07 LAB — CBC
HCT: 36.1 % (ref 36.0–46.0)
Hemoglobin: 11.8 g/dL — ABNORMAL LOW (ref 12.0–15.0)
MCH: 28.3 pg (ref 26.0–34.0)
MCHC: 32.7 g/dL (ref 30.0–36.0)
MCV: 86.6 fL (ref 78.0–100.0)
Platelets: 252 10*3/uL (ref 150–400)
RBC: 4.17 MIL/uL (ref 3.87–5.11)
RDW: 14.8 % (ref 11.5–15.5)
WBC: 8.3 10*3/uL (ref 4.0–10.5)

## 2013-12-07 LAB — PROTEIN / CREATININE RATIO, URINE
Creatinine, Urine: 254.39 mg/dL
Protein Creatinine Ratio: 0.17 — ABNORMAL HIGH (ref 0.00–0.15)
Total Protein, Urine: 44.4 mg/dL

## 2013-12-07 LAB — LACTATE DEHYDROGENASE: LDH: 147 U/L (ref 94–250)

## 2013-12-07 LAB — POCT URINALYSIS DIP (DEVICE)
BILIRUBIN URINE: NEGATIVE
GLUCOSE, UA: NEGATIVE mg/dL
Hgb urine dipstick: NEGATIVE
Ketones, ur: NEGATIVE mg/dL
Leukocytes, UA: NEGATIVE
Nitrite: NEGATIVE
Protein, ur: 30 mg/dL — AB
SPECIFIC GRAVITY, URINE: 1.015 (ref 1.005–1.030)
Urobilinogen, UA: 0.2 mg/dL (ref 0.0–1.0)
pH: 6.5 (ref 5.0–8.0)

## 2013-12-07 LAB — URIC ACID: Uric Acid, Serum: 4.6 mg/dL (ref 2.4–7.0)

## 2013-12-07 MED ORDER — ACETAMINOPHEN 500 MG PO TABS
1000.0000 mg | ORAL_TABLET | Freq: Four times a day (QID) | ORAL | Status: DC | PRN
  Administered 2013-12-07: 1000 mg via ORAL
  Filled 2013-12-07: qty 2

## 2013-12-07 NOTE — MAU Note (Signed)
Patient sent over from the clinic for [facility] evaluation. Denies bleeding or LOF.

## 2013-12-07 NOTE — MAU Provider Note (Signed)
Chief Complaint:  Hypertension; Headache; and Contractions   First Provider Initiated Contact with Patient 12/07/13 1325      HPI: Erika Gonzales is a 33 y.o. [id number] at [redacted]w[redacted]d who presents to maternity admissions reporting H/A and elevated BP for r/o pre-eclampsia.  Has been seen previously in MAU for elevated BP during [place], found to have normal HELLP and BP and thus sent home. Seen in clinic for [place] today and found to have BP of high 130s/80s and complained of HA with "spots" that hasn't resolved w/ any tylenol or fioricet. Feels like someone stabbing in head.  Denies N/V, RUQ pain. Denies worsening swelling.  Does endorse occasional contractions; Denies leakage of fluid or vaginal bleeding. Good fetal movement.   Pregnancy Course:   Past Medical History: Past Medical History  Diagnosis Date  . HSV-2 infection   . Fibroid   . Headache(784.0)   . Ovarian cyst   . Abnormal Pap smear     HPV  . Infection     UTI, chlamydia    Past obstetric history: OB History  Gravida Para Term Preterm AB SAB TAB Ectopic Multiple Living  5 2 1 1 2 2  0 0 0 2    # Outcome Date GA Lbr Len/2nd Weight Sex Delivery Anes PTL Lv  5 Current           4 Preterm 11/09/10 [redacted]w[redacted]d  7 lb 7 oz (3.374 kg) M CS-Unspec   Y     Comments: elevated BP   3 Term 06/07/00 [redacted]w[redacted]d  6 lb (2.722 kg) F CS-LTranv  N Y     Comments: distress; emergency c-section  2 SAB           1 SAB               Past Surgical History: Past Surgical History  Procedure Laterality Date  . Cesarean section    . Tonsillectomy    . Adnoidectomy    . Myomectomy       Family History: Family History  Problem Relation Age of Onset  . Hypertension Mother   . Diabetes Mother   . Diabetes Father   . Heart disease Father     Social History: History  Substance Use Topics  . Smoking status: Current Some Day Smoker -- 0.25 packs/day for 12 years    Types: Cigarettes  . Smokeless tobacco: Never Used  . Alcohol Use: Yes   Comment: weekends; not with preg    Allergies:  Allergies  Allergen Reactions  . Banana Itching    Meds:  Prescriptions prior to admission  Medication Sig Dispense Refill Last Dose  . acetaminophen (TYLENOL) 325 MG tablet Take 650 mg by mouth daily as needed for mild pain or moderate pain.   12/06/2013 at Unknown time  . butalbital-acetaminophen-caffeine (FIORICET WITH CODEINE) 50-325-40-30 MG per capsule Take 1 capsule by mouth every 4 (four) hours as needed for headache.   12/06/2013 at Unknown time  . Ca Carbonate-Mag Hydroxide (ROLAIDS PO) Take 1 tablet by mouth 3 (three) times daily as needed (heartburn).   12/06/2013 at Unknown time  . Prenatal Vit-Fe Fumarate-FA (PRENATAL MULTIVITAMIN) TABS tablet Take 1 tablet by mouth daily at 12 noon.   12/06/2013 at Unknown time  . SUMAtriptan (IMITREX) 50 MG tablet Take 1 tablet (50 mg total) by mouth every 2 (two) hours as needed for migraine or headache. May repeat in 2 hours if headache persists or recurs. Di not take more than 200  mg (4 tablets in 24 hours) 20 tablet 2 12/06/2013 at Unknown time    ROS: Pertinent findings in history of present illness.  Physical Exam  Blood pressure 90/51, pulse 72, temperature 98.5 F (36.9 C), temperature source Oral, resp. rate 18, height 5\' 6"  (1.676 m), weight 208 lb 8.9 oz (94.6 kg), last menstrual period 03/12/2013, unknown if currently breastfeeding. GENERAL: Well-developed, well-nourished female in no acute distress.  HEENT: normocephalic HEART: normal rate RESP: normal effort ABDOMEN: Soft, non-tender, gravid appropriate for gestational age EXTREMITIES: Nontender, no edema NEURO: alert and oriented SPECULUM EXAM: NEFG, physiologic discharge, no blood, cervix clean    FHT:  Baseline 150 , moderate variability, accelerations present, no decelerations Contractions: q 7-12 mins   Labs: Results for orders placed or performed during the hospital encounter of 12/07/13 (from the past 24  hour(s))  Protein / creatinine ratio, urine     Status: Abnormal   Collection Time: 12/07/13 10:52 AM  Result Value Ref Range   Creatinine, Urine 254.39 mg/dL   Total Protein, Urine 44.4 mg/dL   Protein Creatinine Ratio 0.17 (H) 0.00 - 0.15  Urinalysis, Routine w reflex microscopic     Status: None   Collection Time: 12/07/13 10:52 AM  Result Value Ref Range   Color, Urine YELLOW YELLOW   APPearance CLEAR CLEAR   Specific Gravity, Urine 1.025 1.005 - 1.030   pH 6.5 5.0 - 8.0   Glucose, UA NEGATIVE NEGATIVE mg/dL   Hgb urine dipstick NEGATIVE NEGATIVE   Bilirubin Urine NEGATIVE NEGATIVE   Ketones, ur NEGATIVE NEGATIVE mg/dL   Protein, ur NEGATIVE NEGATIVE mg/dL   Urobilinogen, UA 0.2 0.0 - 1.0 mg/dL   Nitrite NEGATIVE NEGATIVE   Leukocytes, UA NEGATIVE NEGATIVE  CBC     Status: Abnormal   Collection Time: 12/07/13 12:16 PM  Result Value Ref Range   WBC 8.3 4.0 - 10.5 K/uL   RBC 4.17 3.87 - 5.11 MIL/uL   Hemoglobin 11.8 (L) 12.0 - 15.0 g/dL   HCT 36.1 36.0 - 46.0 %   MCV 86.6 78.0 - 100.0 fL   MCH 28.3 26.0 - 34.0 pg   MCHC 32.7 30.0 - 36.0 g/dL   RDW 14.8 11.5 - 15.5 %   Platelets 252 150 - 400 K/uL  Comprehensive metabolic panel     Status: Abnormal   Collection Time: 12/07/13 12:16 PM  Result Value Ref Range   Sodium 135 (L) 137 - 147 mEq/L   Potassium 4.2 3.7 - 5.3 mEq/L   Chloride 101 96 - 112 mEq/L   CO2 24 19 - 32 mEq/L   Glucose, Bld 97 70 - 99 mg/dL   BUN 9 6 - 23 mg/dL   Creatinine, Ser 0.71 0.50 - 1.10 mg/dL   Calcium 8.8 8.4 - 10.5 mg/dL   Total Protein 5.8 (L) 6.0 - 8.3 g/dL   Albumin 2.3 (L) 3.5 - 5.2 g/dL   AST 12 0 - 37 U/L   ALT 14 0 - 35 U/L   Alkaline Phosphatase 431 (H) 39 - 117 U/L   Total Bilirubin <0.2 (L) 0.3 - 1.2 mg/dL   GFR calc non Af Amer >90 >90 mL/min   GFR calc Af Amer >90 >90 mL/min   Anion gap 10 5 - 15  Uric acid     Status: None   Collection Time: 12/07/13 12:16 PM  Result Value Ref Range   Uric Acid, Serum 4.6 2.4 - 7.0 mg/dL   Lactate dehydrogenase  Status: None   Collection Time: 12/07/13 12:16 PM  Result Value Ref Range   LDH 147 94 - 250 U/L    Imaging:  US Ob Follow Up  11/26/2013   OBSTETRICAL ULTRASOUND: This exam was performed within a [facility]. The OB US report was generated in the AS system, and faxed to the ordering physician.   This report is available in the [facility]. See the AS Obstetric US report via the Image Link.  MAU Course:  BP in MAU WNL, all < 130s/80s. HELLP labs WNL, UP:C 0.17, only slightly up from 0.13.  HA improved with 1 g tylenol.   Assessment: Elevated Blood Pressure, r/o pre-eclampsia  Plan: Discharge home Pre-eclampsia precuations Labor precautions and fetal kick counts    Medication List    ASK your doctor about these medications        acetaminophen 325 MG tablet  Commonly known as:  TYLENOL  Take 650 mg by mouth daily as needed for mild pain or moderate pain.     butalbital-acetaminophen-caffeine 50-325-40-30 MG per capsule  Commonly known as:  FIORICET WITH CODEINE  Take 1 capsule by mouth every 4 (four) hours as needed for headache.     prenatal multivitamin Tabs tablet  Take 1 tablet by mouth daily at 12 noon.     ROLAIDS PO  Take 1 tablet by mouth 3 (three) times daily as needed (heartburn).     SUMAtriptan 50 MG tablet  Commonly known as:  IMITREX  Take 1 tablet (50 mg total) by mouth every 2 (two) hours as needed for migraine or headache. May repeat in 2 hours if headache persists or recurs. Di not take more than 200 mg (4 tablets in 24 hours)        Josephine Cables, MD 12/07/2013 1:25 PM

## 2013-12-07 NOTE — Progress Notes (Signed)
Pt has had headache for past two days with no relief from Tylenol or Fiorcet, reports blurred vision but denies epigastric pain.

## 2013-12-07 NOTE — Progress Notes (Signed)
C-section schedule for 11/27 with BTL.   States that she has had increasing contractions since last week, intermittent. Will get them for several hours, but then they will stop. About 7-10 minutes apart +FM, no vomiting, no LOF.   Pt has had headache for past two days with no relief from Tylenol or Fiorcet, reports blurred vision, with a "blind spot" in L eye but denies epigastric pain. Some nausea, no vomiting.  --> Go to MAU for BP monitoring and labs given new HA and slightly elevated BP and new proteinuria.

## 2013-12-07 NOTE — MAU Note (Signed)
Urine in lab 

## 2013-12-07 NOTE — Telephone Encounter (Signed)
Patient called and left message stating she would like to know if she can get her intermittent FMLA hours increased to 24 hours.

## 2013-12-08 ENCOUNTER — Other Ambulatory Visit: Payer: Self-pay

## 2013-12-08 ENCOUNTER — Encounter (HOSPITAL_COMMUNITY): Payer: Self-pay | Admitting: *Deleted

## 2013-12-08 ENCOUNTER — Inpatient Hospital Stay (HOSPITAL_COMMUNITY)
Admission: AD | Admit: 2013-12-08 | Discharge: 2013-12-10 | DRG: 765 | Disposition: A | Discharge status: Home / Self Care | Payer: 59 | Source: Ambulatory Visit | Attending: Family Medicine | Admitting: Family Medicine

## 2013-12-08 ENCOUNTER — Inpatient Hospital Stay (HOSPITAL_COMMUNITY): Payer: 59 | Admitting: Anesthesiology

## 2013-12-08 ENCOUNTER — Encounter (HOSPITAL_COMMUNITY)
Admission: AD | Disposition: A | Discharge status: Home / Self Care | Payer: Self-pay | Source: Ambulatory Visit | Attending: Family Medicine

## 2013-12-08 DIAGNOSIS — R51 Headache: Secondary | ICD-10-CM | POA: Diagnosis present

## 2013-12-08 DIAGNOSIS — Z3483 Encounter for supervision of other normal pregnancy, third trimester: Secondary | ICD-10-CM

## 2013-12-08 DIAGNOSIS — O99334 Smoking (tobacco) complicating childbirth: Secondary | ICD-10-CM | POA: Diagnosis present

## 2013-12-08 DIAGNOSIS — O09291 Supervision of pregnancy with other poor reproductive or obstetric history, first trimester: Secondary | ICD-10-CM

## 2013-12-08 DIAGNOSIS — O3421 Maternal care for scar from previous cesarean delivery: Secondary | ICD-10-CM | POA: Diagnosis present

## 2013-12-08 DIAGNOSIS — Z302 Encounter for sterilization: Secondary | ICD-10-CM

## 2013-12-08 DIAGNOSIS — Z8249 Family history of ischemic heart disease and other diseases of the circulatory system: Secondary | ICD-10-CM

## 2013-12-08 DIAGNOSIS — O9852 Other viral diseases complicating childbirth: Secondary | ICD-10-CM | POA: Diagnosis present

## 2013-12-08 DIAGNOSIS — Z833 Family history of diabetes mellitus: Secondary | ICD-10-CM

## 2013-12-08 DIAGNOSIS — B009 Herpesviral infection, unspecified: Secondary | ICD-10-CM | POA: Diagnosis present

## 2013-12-08 DIAGNOSIS — O09293 Supervision of pregnancy with other poor reproductive or obstetric history, third trimester: Secondary | ICD-10-CM | POA: Diagnosis not present

## 2013-12-08 DIAGNOSIS — Z3A38 38 weeks gestation of pregnancy: Secondary | ICD-10-CM

## 2013-12-08 DIAGNOSIS — O285 Abnormal chromosomal and genetic finding on antenatal screening of mother: Secondary | ICD-10-CM

## 2013-12-08 DIAGNOSIS — O34219 Maternal care for unspecified type scar from previous cesarean delivery: Secondary | ICD-10-CM

## 2013-12-08 DIAGNOSIS — O09213 Supervision of pregnancy with history of pre-term labor, third trimester: Secondary | ICD-10-CM | POA: Diagnosis not present

## 2013-12-08 DIAGNOSIS — Z98891 History of uterine scar from previous surgery: Secondary | ICD-10-CM

## 2013-12-08 LAB — CBC
HEMATOCRIT: 37.7 % (ref 36.0–46.0)
Hemoglobin: 12.8 g/dL (ref 12.0–15.0)
MCH: 29 pg (ref 26.0–34.0)
MCHC: 34 g/dL (ref 30.0–36.0)
MCV: 85.3 fL (ref 78.0–100.0)
PLATELETS: 276 10*3/uL (ref 150–400)
RBC: 4.42 MIL/uL (ref 3.87–5.11)
RDW: 14.7 % (ref 11.5–15.5)
WBC: 10.6 10*3/uL — AB (ref 4.0–10.5)

## 2013-12-08 LAB — TYPE AND SCREEN
ABO/RH(D): O POS
Antibody Screen: NEGATIVE

## 2013-12-08 LAB — RPR

## 2013-12-08 LAB — RAPID HIV SCREEN (WH-MAU): SUDS RAPID HIV SCREEN: NONREACTIVE

## 2013-12-08 SURGERY — Surgical Case
Anesthesia: Spinal

## 2013-12-08 MED ORDER — CITRIC ACID-SODIUM CITRATE 334-500 MG/5ML PO SOLN
30.0000 mL | Freq: Once | ORAL | Status: AC
Start: 2013-12-08 — End: 2013-12-08
  Administered 2013-12-08: 30 mL via ORAL
  Filled 2013-12-08: qty 15

## 2013-12-08 MED ORDER — WITCH HAZEL-GLYCERIN EX PADS: 1.0000 "application " | MEDICATED_PAD | CUTANEOUS | Status: DC | PRN

## 2013-12-08 MED ORDER — NALOXONE HCL 0.4 MG/ML IJ SOLN: 0.4000 mg | INTRAMUSCULAR | Status: DC | PRN

## 2013-12-08 MED ORDER — FAMOTIDINE IN NACL 20-0.9 MG/50ML-% IV SOLN
20.0000 mg | Freq: Once | INTRAVENOUS | Status: AC
Start: 2013-12-08 — End: 2013-12-08
  Administered 2013-12-08: 20 mg via INTRAVENOUS
  Filled 2013-12-08: qty 50

## 2013-12-08 MED ORDER — TERBUTALINE SULFATE 1 MG/ML IJ SOLN
INTRAMUSCULAR | Status: AC
  Filled 2013-12-08: qty 1

## 2013-12-08 MED ORDER — IBUPROFEN 600 MG PO TABS
600.0000 mg | ORAL_TABLET | Freq: Four times a day (QID) | ORAL | Status: DC
  Administered 2013-12-08 – 2013-12-10 (×9): 600 mg via ORAL
  Filled 2013-12-08 (×9): qty 1

## 2013-12-08 MED ORDER — KETOROLAC TROMETHAMINE 30 MG/ML IJ SOLN: 30.0000 mg | Freq: Four times a day (QID) | INTRAMUSCULAR | Status: AC | PRN

## 2013-12-08 MED ORDER — LACTATED RINGERS IV SOLN: 500.0000 mL | Freq: Once | INTRAVENOUS | Status: DC

## 2013-12-08 MED ORDER — PRENATAL MULTIVITAMIN CH
1.0000 | ORAL_TABLET | Freq: Every day | ORAL | Status: DC
  Administered 2013-12-08 – 2013-12-10 (×3): 1 via ORAL
  Filled 2013-12-08 (×3): qty 1

## 2013-12-08 MED ORDER — FENTANYL CITRATE 0.05 MG/ML IJ SOLN
INTRAMUSCULAR | Status: DC | PRN
  Administered 2013-12-08: 50 ug via INTRAVENOUS
  Administered 2013-12-08: 15 ug via INTRATHECAL
  Administered 2013-12-08: 35 ug via INTRAVENOUS

## 2013-12-08 MED ORDER — PHENYLEPHRINE HCL 10 MG/ML IJ SOLN
INTRAMUSCULAR | Status: DC | PRN
  Administered 2013-12-08: 60 ug via INTRAVENOUS
  Administered 2013-12-08: 80 ug via INTRAVENOUS

## 2013-12-08 MED ORDER — SIMETHICONE 80 MG PO CHEW
80.0000 mg | CHEWABLE_TABLET | Freq: Three times a day (TID) | ORAL | Status: DC
  Administered 2013-12-08 – 2013-12-10 (×7): 80 mg via ORAL
  Filled 2013-12-08 (×13): qty 1

## 2013-12-08 MED ORDER — TETANUS-DIPHTH-ACELL PERTUSSIS 5-2.5-18.5 LF-MCG/0.5 IM SUSP: 0.5000 mL | Freq: Once | INTRAMUSCULAR | Status: DC

## 2013-12-08 MED ORDER — PHENYLEPHRINE 40 MCG/ML (10ML) SYRINGE FOR IV PUSH (FOR BLOOD PRESSURE SUPPORT)
80.0000 ug | PREFILLED_SYRINGE | INTRAVENOUS | Status: DC | PRN
  Filled 2013-12-08: qty 2

## 2013-12-08 MED ORDER — TERBUTALINE SULFATE 1 MG/ML IJ SOLN
0.2500 mg | Freq: Once | INTRAMUSCULAR | Status: AC
  Administered 2013-12-08: 0.25 mg via SUBCUTANEOUS

## 2013-12-08 MED ORDER — LANOLIN HYDROUS EX OINT: 1.0000 "application " | TOPICAL_OINTMENT | CUTANEOUS | Status: DC | PRN

## 2013-12-08 MED ORDER — FENTANYL CITRATE 0.05 MG/ML IJ SOLN: 25.0000 ug | INTRAMUSCULAR | Status: DC | PRN

## 2013-12-08 MED ORDER — SENNOSIDES-DOCUSATE SODIUM 8.6-50 MG PO TABS
2.0000 | ORAL_TABLET | ORAL | Status: DC
  Administered 2013-12-08 – 2013-12-09 (×2): 2 via ORAL
  Filled 2013-12-08 (×4): qty 2

## 2013-12-08 MED ORDER — DIPHENHYDRAMINE HCL 25 MG PO CAPS
25.0000 mg | ORAL_CAPSULE | ORAL | Status: DC | PRN
  Filled 2013-12-08: qty 1

## 2013-12-08 MED ORDER — OXYCODONE-ACETAMINOPHEN 5-325 MG PO TABS
1.0000 | ORAL_TABLET | ORAL | Status: DC | PRN
  Filled 2013-12-08 (×2): qty 1

## 2013-12-08 MED ORDER — NALBUPHINE HCL 10 MG/ML IJ SOLN: 5.0000 mg | INTRAMUSCULAR | Status: DC | PRN

## 2013-12-08 MED ORDER — PROMETHAZINE HCL 25 MG/ML IJ SOLN: 6.2500 mg | INTRAMUSCULAR | Status: DC | PRN

## 2013-12-08 MED ORDER — PHENYLEPHRINE 8 MG IN D5W 100 ML (0.08MG/ML) PREMIX OPTIME
INJECTION | INTRAVENOUS | Status: DC | PRN
  Administered 2013-12-08: 60 ug/min via INTRAVENOUS

## 2013-12-08 MED ORDER — KETOROLAC TROMETHAMINE 30 MG/ML IJ SOLN
15.0000 mg | Freq: Once | INTRAMUSCULAR | Status: AC | PRN
  Administered 2013-12-08: 30 mg via INTRAVENOUS

## 2013-12-08 MED ORDER — SODIUM CHLORIDE 0.9 % IJ SOLN: 3.0000 mL | INTRAMUSCULAR | Status: DC | PRN

## 2013-12-08 MED ORDER — SIMETHICONE 80 MG PO CHEW
80.0000 mg | CHEWABLE_TABLET | ORAL | Status: DC
  Administered 2013-12-08 – 2013-12-09 (×2): 80 mg via ORAL
  Filled 2013-12-08 (×4): qty 1

## 2013-12-08 MED ORDER — SCOPOLAMINE 1 MG/3DAYS TD PT72: 1.0000 | MEDICATED_PATCH | Freq: Once | TRANSDERMAL | Status: DC

## 2013-12-08 MED ORDER — DIBUCAINE 1 % RE OINT: 1.0000 "application " | TOPICAL_OINTMENT | RECTAL | Status: DC | PRN

## 2013-12-08 MED ORDER — EPHEDRINE 5 MG/ML INJ
10.0000 mg | INTRAVENOUS | Status: DC | PRN
  Filled 2013-12-08: qty 2

## 2013-12-08 MED ORDER — LACTATED RINGERS IV SOLN
INTRAVENOUS | Status: DC
  Administered 2013-12-08 (×4): via INTRAVENOUS

## 2013-12-08 MED ORDER — SCOPOLAMINE 1 MG/3DAYS TD PT72
MEDICATED_PATCH | TRANSDERMAL | Status: DC | PRN
  Administered 2013-12-08: 1 via TRANSDERMAL

## 2013-12-08 MED ORDER — ONDANSETRON HCL 4 MG PO TABS: 4.0000 mg | ORAL_TABLET | ORAL | Status: DC | PRN

## 2013-12-08 MED ORDER — ONDANSETRON HCL 4 MG/2ML IJ SOLN: 4.0000 mg | INTRAMUSCULAR | Status: DC | PRN

## 2013-12-08 MED ORDER — LACTATED RINGERS IV SOLN
INTRAVENOUS | Status: DC | PRN
  Administered 2013-12-08 (×3): via INTRAVENOUS

## 2013-12-08 MED ORDER — DIPHENHYDRAMINE HCL 50 MG/ML IJ SOLN: 12.5000 mg | INTRAMUSCULAR | Status: DC | PRN

## 2013-12-08 MED ORDER — LIDOCAINE HCL (CARDIAC) 20 MG/ML IV SOLN
INTRAVENOUS | Status: AC
  Administered 2013-12-08: 100 mg
  Filled 2013-12-08: qty 5

## 2013-12-08 MED ORDER — DIPHENHYDRAMINE HCL 25 MG PO CAPS
25.0000 mg | ORAL_CAPSULE | Freq: Four times a day (QID) | ORAL | Status: DC | PRN
  Filled 2013-12-08: qty 1

## 2013-12-08 MED ORDER — NALBUPHINE HCL 10 MG/ML IJ SOLN: 5.0000 mg | Freq: Once | INTRAMUSCULAR | Status: AC | PRN

## 2013-12-08 MED ORDER — SIMETHICONE 80 MG PO CHEW
80.0000 mg | CHEWABLE_TABLET | ORAL | Status: DC | PRN
  Filled 2013-12-08: qty 1

## 2013-12-08 MED ORDER — LACTATED RINGERS IV BOLUS (SEPSIS)
1000.0000 mL | Freq: Once | INTRAVENOUS | Status: AC
  Administered 2013-12-08: 1000 mL via INTRAVENOUS

## 2013-12-08 MED ORDER — PHENYLEPHRINE HCL 10 MG/ML IJ SOLN: INTRAMUSCULAR | Status: DC | PRN

## 2013-12-08 MED ORDER — MENTHOL 3 MG MT LOZG: 1.0000 | LOZENGE | OROMUCOSAL | Status: DC | PRN

## 2013-12-08 MED ORDER — OXYTOCIN 40 UNITS IN LACTATED RINGERS INFUSION - SIMPLE MED: 62.5000 mL/h | INTRAVENOUS | Status: AC

## 2013-12-08 MED ORDER — MORPHINE SULFATE (PF) 0.5 MG/ML IJ SOLN
INTRAMUSCULAR | Status: DC | PRN
  Administered 2013-12-08: 1 mg via EPIDURAL
  Administered 2013-12-08: .1 mg via INTRATHECAL
  Administered 2013-12-08: 1 mg via EPIDURAL

## 2013-12-08 MED ORDER — KETOROLAC TROMETHAMINE 30 MG/ML IJ SOLN
INTRAMUSCULAR | Status: AC
  Filled 2013-12-08: qty 1

## 2013-12-08 MED ORDER — MEPERIDINE HCL 25 MG/ML IJ SOLN: 6.2500 mg | INTRAMUSCULAR | Status: DC | PRN

## 2013-12-08 MED ORDER — ONDANSETRON HCL 4 MG/2ML IJ SOLN: 4.0000 mg | Freq: Three times a day (TID) | INTRAMUSCULAR | Status: DC | PRN

## 2013-12-08 MED ORDER — BUPIVACAINE HCL (PF) 0.25 % IJ SOLN
INTRAMUSCULAR | Status: DC | PRN
  Administered 2013-12-08: 30 mL

## 2013-12-08 MED ORDER — OXYCODONE-ACETAMINOPHEN 5-325 MG PO TABS
2.0000 | ORAL_TABLET | ORAL | Status: DC | PRN
  Administered 2013-12-08 – 2013-12-10 (×7): 2 via ORAL
  Filled 2013-12-08 (×6): qty 2

## 2013-12-08 MED ORDER — ONDANSETRON HCL 4 MG/2ML IJ SOLN
INTRAMUSCULAR | Status: DC | PRN
  Administered 2013-12-08: 4 mg via INTRAVENOUS

## 2013-12-08 MED ORDER — ZOLPIDEM TARTRATE 5 MG PO TABS: 5.0000 mg | ORAL_TABLET | Freq: Every evening | ORAL | Status: DC | PRN

## 2013-12-08 MED ORDER — OXYTOCIN 10 UNIT/ML IJ SOLN
INTRAMUSCULAR | Status: DC | PRN
  Administered 2013-12-08: 40 [IU]

## 2013-12-08 MED ORDER — CEFAZOLIN SODIUM-DEXTROSE 2-3 GM-% IV SOLR
2.0000 g | INTRAVENOUS | Status: AC
  Administered 2013-12-08: 2 g via INTRAVENOUS

## 2013-12-08 MED ORDER — NALOXONE HCL 1 MG/ML IJ SOLN
1.0000 ug/kg/h | INTRAMUSCULAR | Status: DC | PRN
  Filled 2013-12-08: qty 2

## 2013-12-08 MED ORDER — LACTATED RINGERS IV SOLN
INTRAVENOUS | Status: DC
  Administered 2013-12-08 (×2): via INTRAVENOUS

## 2013-12-08 MED ORDER — BUPIVACAINE IN DEXTROSE 0.75-8.25 % IT SOLN
INTRATHECAL | Status: DC | PRN
Start: 2013-12-08 — End: 2013-12-08
  Administered 2013-12-08: 1.5 mL via INTRATHECAL

## 2013-12-08 SURGICAL SUPPLY — 39 items
APL SKNCLS STERI-STRIP NONHPOA (GAUZE/BANDAGES/DRESSINGS) ×1
BENZOIN TINCTURE PRP APPL 2/3 (GAUZE/BANDAGES/DRESSINGS) ×3 IMPLANT
CATH ROBINSON RED A/P 16FR (CATHETERS) IMPLANT
CLAMP CORD UMBIL (MISCELLANEOUS) IMPLANT
CLIP FILSHIE TUBAL LIGA STRL (Clip) ×4 IMPLANT
CLOSURE STERI STRIP 1/2 X4 (GAUZE/BANDAGES/DRESSINGS) ×1 IMPLANT
CLOSURE WOUND 1/2 X4 (GAUZE/BANDAGES/DRESSINGS) ×1
CLOTH BEACON ORANGE TIMEOUT ST (SAFETY) ×3 IMPLANT
DRAPE SHEET LG 3/4 BI-LAMINATE (DRAPES) IMPLANT
DRSG OPSITE POSTOP 4X10 (GAUZE/BANDAGES/DRESSINGS) ×3 IMPLANT
DRSG TELFA 3X8 NADH (GAUZE/BANDAGES/DRESSINGS) ×3 IMPLANT
DURAPREP 26ML APPLICATOR (WOUND CARE) ×3 IMPLANT
ELECT REM PT RETURN 9FT ADLT (ELECTROSURGICAL) ×3
ELECTRODE REM PT RTRN 9FT ADLT (ELECTROSURGICAL) ×1 IMPLANT
EXTRACTOR VACUUM M CUP 4 TUBE (SUCTIONS) IMPLANT
EXTRACTOR VACUUM M CUP 4' TUBE (SUCTIONS)
GLOVE BIOGEL PI IND STRL 7.5 (GLOVE) ×2 IMPLANT
GLOVE BIOGEL PI INDICATOR 7.5 (GLOVE) ×4
GLOVE ECLIPSE 7.5 STRL STRAW (GLOVE) ×3 IMPLANT
GOWN STRL REUS W/TWL LRG LVL3 (GOWN DISPOSABLE) ×9 IMPLANT
KIT ABG SYR 3ML LUER SLIP (SYRINGE) IMPLANT
NDL HYPO 25X5/8 SAFETYGLIDE (NEEDLE) IMPLANT
NEEDLE HYPO 22GX1.5 SAFETY (NEEDLE) ×3 IMPLANT
NEEDLE HYPO 25X5/8 SAFETYGLIDE (NEEDLE) IMPLANT
NS IRRIG 1000ML POUR BTL (IV SOLUTION) ×3 IMPLANT
PACK C SECTION WH (CUSTOM PROCEDURE TRAY) ×3 IMPLANT
PAD DRESSING TELFA 3X8 NADH (GAUZE/BANDAGES/DRESSINGS) ×1 IMPLANT
PAD OB MATERNITY 4.3X12.25 (PERSONAL CARE ITEMS) ×3 IMPLANT
RTRCTR C-SECT PINK 25CM LRG (MISCELLANEOUS) IMPLANT
STRIP CLOSURE SKIN 1/2X4 (GAUZE/BANDAGES/DRESSINGS) ×2 IMPLANT
SUT MNCRL 0 VIOLET CTX 36 (SUTURE) IMPLANT
SUT MONOCRYL 0 CTX 36 (SUTURE)
SUT VIC AB 0 CTX 36 (SUTURE) ×9
SUT VIC AB 0 CTX36XBRD ANBCTRL (SUTURE) ×3 IMPLANT
SUT VIC AB 4-0 KS 27 (SUTURE) ×3 IMPLANT
SYR 30ML LL (SYRINGE) ×3 IMPLANT
TOWEL OR 17X24 6PK STRL BLUE (TOWEL DISPOSABLE) ×3 IMPLANT
TRAY FOLEY CATH 14FR (SET/KITS/TRAYS/PACK) ×3 IMPLANT
WATER STERILE IRR 1000ML POUR (IV SOLUTION) ×3 IMPLANT

## 2013-12-08 NOTE — Addendum Note (Signed)
Addendum  created 12/08/13 1933 by Billie Lade, CRNA   Modules edited: Notes Section   Notes Section:  File: [id number]

## 2013-12-08 NOTE — Telephone Encounter (Signed)
Upon review of chart, patient is currently having unscheduled cesarean section.  Will close encounter.

## 2013-12-08 NOTE — Transfer of Care (Signed)
Immediate Anesthesia Transfer of Care Note  Patient: Erika Gonzales  Procedure(s) Performed: Procedure(s): CESAREAN SECTION WITH BILATERAL TUBAL LIGATION (N/A)  Patient Location: PACU  Anesthesia Type:Spinal  Level of Consciousness: awake, alert , oriented and patient cooperative  Airway & Oxygen Therapy: Patient Spontanous Breathing  Post-op Assessment: Report given to PACU RN and Post -op Vital signs reviewed and stable  Post vital signs: Reviewed and stable  Complications: No apparent anesthesia complications

## 2013-12-08 NOTE — Anesthesia Procedure Notes (Signed)
Spinal Patient location during procedure: OR Start time: 12/08/2013 4:21 AM Staffing Anesthesiologist: CASSIDY, AMY Performed by: anesthesiologist  Preanesthetic Checklist Completed: patient identified, site marked, surgical consent, pre-op evaluation, timeout performed, IV checked, risks and benefits discussed and monitors and equipment checked Spinal Block Patient position: sitting Prep: site prepped and draped and DuraPrep Patient monitoring: heart rate, cardiac monitor, continuous pulse ox and blood pressure Approach: midline Location: L3-4 Injection technique: single-shot Needle Needle type: Pencan  Needle gauge: 24 G Needle length: 9 cm Assessment Sensory level: T4 Additional Notes Clear free flow CSF on first attempt.  No paresthesia.  Patient tolerated procedure with no apparent complications.  Charlton Haws, MD

## 2013-12-08 NOTE — Lactation Note (Signed)
This note was copied from the chart of Erika Gonzales. Lactation Consultation Note  Initial visit done.  Breastfeeding consultation services and support information given and reviewed with mom.  This is mom's third baby but first time breastfeeding.  Discussed basics with mom and explained importance of good feedings because of low infant weight.  Baby is 8 hours old and has one good feeding in PACU.  Assisted mom with positioning baby in football hold.  Mom shown hand expression and large drops of colostrum obtained.  Baby is sleepy but did latch and sucked on and off for 5 minutes.  DEBP set up and initiated to stimulate milk supply.   A few drops of colostrum given to baby with gloved finger.  Instructed to feed with feeding cues and pump every 3 hours followed hand expression.   Encouraged to call for concerns/assist prn. Patient Name: Erika Gonzales [id number] Date: 12/08/2013 Reason for consult: Initial assessment;Infant < 6lbs   Maternal Data Has patient been taught Hand Expression?: Yes Does the patient have breastfeeding experience prior to this delivery?: No  Feeding Feeding Type: Breast Fed Length of feed: 5 min  LATCH Score/Interventions Latch: Repeated attempts needed to sustain latch, nipple held in mouth throughout feeding, stimulation needed to elicit sucking reflex. Intervention(s): Adjust position;Assist with latch;Breast massage;Breast compression  Audible Swallowing: None Intervention(s): Skin to skin;Hand expression  Type of Nipple: Everted at rest and after stimulation  Comfort (Breast/Nipple): Soft / non-tender     Hold (Positioning): Assistance needed to correctly position infant at breast and maintain latch. Intervention(s): Breastfeeding basics reviewed;Support Pillows  LATCH Score: 6  Lactation Tools Discussed/Used Pump Review: Setup, frequency, and cleaning;Milk Storage Initiated by:: LMoulden RN [place] Date initiated:: 12/08/13   Consult  Status Consult Status: Follow-up Date: 12/09/13 Follow-up type: In-patient    Ave Filter 12/08/2013, 2:43 PM

## 2013-12-08 NOTE — Progress Notes (Signed)
Contacted faculty practice attending in regards to patient's EKG. Informed that Dr. Deniece Ree has reviewed the EKG and instructed the patient to avoid caffeine. No new orders at this time.

## 2013-12-08 NOTE — Anesthesia Preprocedure Evaluation (Addendum)
Anesthesia Evaluation  Patient identified by MRN, date of birth, ID band Patient awake    Reviewed: Allergy & Precautions, H&P , NPO status , Patient's Chart, lab work & pertinent test results, reviewed documented beta blocker date and time   History of Anesthesia Complications Negative for: history of anesthetic complications  Airway Mallampati: IV  TM Distance: >3 FB Neck ROM: full  Mouth opening: Limited Mouth Opening  Dental  (+) Teeth Intact   Pulmonary shortness of breath (for past two weeks, when awake "randomly" feels breathless and dizzy, denies CP), Current Smoker,  breath sounds clear to auscultation  Pulmonary exam normal       Cardiovascular hypertension, + dysrhythmias (PVCs, palpitations) Rhythm:regular Rate:Normal     Neuro/Psych  Headaches (off and on during this pregnancy - took meds for HA today), negative psych ROS   GI/Hepatic negative GI ROS, Neg liver ROS,   Endo/Other  negative endocrine ROSBMI 35  Renal/GU negative Renal ROS  Female GU complaint     Musculoskeletal   Abdominal   Peds  Hematology negative hematology ROS (+)   Anesthesia Other Findings Last ate full meal at 2000 on 12/07/13  Reproductive/Obstetrics (+) Pregnancy (h/o C/S x2, labor, non reassurring fetal status --> repeat C/S)                            Anesthesia Physical Anesthesia Plan  ASA: III  Anesthesia Plan: Spinal   Post-op Pain Management:    Induction:   Airway Management Planned:   Additional Equipment:   Intra-op Plan:   Post-operative Plan:   Informed Consent: I have reviewed the patients History and Physical, chart, labs and discussed the procedure including the risks, benefits and alternatives for the proposed anesthesia with the patient or authorized representative who has indicated his/her understanding and acceptance.     Plan Discussed with: Surgeon and  CRNA  Anesthesia Plan Comments:         Anesthesia Quick Evaluation

## 2013-12-08 NOTE — MAU Note (Signed)
PT  SAYS SHE STARTED HURTING   AT 8PM.      THIS IS REPT C/S-  [facility]  FOR 11-27.   [place]   DENIES HSV AND MRSA.      GBS- NEG.   LAST ATE  / DRANK -  7PM

## 2013-12-08 NOTE — H&P (Signed)
LABOR ADMISSION HISTORY AND PHYSICAL  Erika Gonzales is a 33 y.o. female [phone] with IUP at [redacted]w[redacted]d by L/19 presenting for contractions. Pt presents w/ contractions since this AM. She reports +FMs, No LOF, no VB, no blurry vision, headaches or peripheral edema, and RUQ pain. She desires repeat c-section and BTL given h/o c-section x 2. She plans on breast and bottle feeding.   Dating: By L/19 wk [place] --->  Estimated Date of Delivery: 12/17/13  Sono:   CWD, normal anatomy   Prenatal History/Complications: Tobacco Use HSV-2 on PPx at 34 wks H/o chlamydia H/o incomptent cervix with IUFD at 15 wks H/o PTD at [redacted]w[redacted]d Low PAPP-A, normal growth [place] Elevated BP w/out diagnosis of gHTN or pre-eclampsia  Past Medical History: Past Medical History  Diagnosis Date  . HSV-2 infection   . Fibroid   . Headache(784.0)   . Ovarian cyst   . Abnormal Pap smear     HPV  . Infection     UTI, chlamydia    Past Surgical History: Past Surgical History  Procedure Laterality Date  . Cesarean section    . Tonsillectomy    . Adnoidectomy    . Myomectomy      Obstetrical History: OB History    Gravida Para Term Preterm AB TAB SAB Ectopic Multiple Living   5 2 1 1 2  0 2 0 0 2      Gynecological History: OB History    Gravida Para Term Preterm AB TAB SAB Ectopic Multiple Living   5 2 1 1 2  0 2 0 0 2      Social History: History   Social History  . Marital Status: Married    Spouse Name: N/A    Number of Children: N/A  . Years of Education: N/A   Social History Main Topics  . Smoking status: Current Some Day Smoker -- 0.25 packs/day for 12 years    Types: Cigarettes  . Smokeless tobacco: Never Used  . Alcohol Use: Yes     Comment: weekends; not with preg  . Drug Use: No  . Sexual Activity: Yes    Birth Control/ Protection: None   Other Topics Concern  . None   Social History Narrative    Family History: Family History  Problem Relation Age of Onset  . Hypertension  Mother   . Diabetes Mother   . Diabetes Father   . Heart disease Father     Allergies: Allergies  Allergen Reactions  . Banana Itching    Prescriptions prior to admission  Medication Sig Dispense Refill Last Dose  . acetaminophen (TYLENOL) 325 MG tablet Take 650 mg by mouth daily as needed for mild pain or moderate pain.   12/08/2013 at Unknown time  . butalbital-acetaminophen-caffeine (FIORICET WITH CODEINE) 50-325-40-30 MG per capsule Take 1 capsule by mouth every 4 (four) hours as needed for headache.   12/08/2013 at Unknown time  . Ca Carbonate-Mag Hydroxide (ROLAIDS PO) Take 1 tablet by mouth 3 (three) times daily as needed (heartburn).   12/08/2013 at Unknown time  . Prenatal Vit-Fe Fumarate-FA (PRENATAL MULTIVITAMIN) TABS tablet Take 1 tablet by mouth daily at 12 noon.   12/08/2013 at Unknown time  . SUMAtriptan (IMITREX) 50 MG tablet Take 1 tablet (50 mg total) by mouth every 2 (two) hours as needed for migraine or headache. May repeat in 2 hours if headache persists or recurs. Di not take more than 200 mg (4 tablets in 24 hours) 20 tablet 2  Unknown at Unknown time     Review of Systems   All systems reviewed and negative except as stated in HPI  Blood pressure 143/64, pulse 57, temperature 97.5 F (36.4 C), temperature source Oral, resp. rate 20, height 5\' 5"  (1.651 m), weight 210 lb 4 oz (95.369 kg), last menstrual period 03/12/2013, unknown if currently breastfeeding. General appearance: alert, cooperative and appears stated age Lungs: clear to auscultation bilaterally Heart: regular rate and rhythm Abdomen: soft, non-tender; bowel sounds normal Pelvic: Erika Gonzales Extremities: Homans sign is negative, no sign of DVT Presentation: cephalic Fetal monitoringBaseline: 150 bpm, Variability: Good {> 6 bpm), Accelerations: Reactive and Decelerations: Late, repetive Uterine activityDate/time of onset: 11/23 in AM, Frequency: Every 4-6 minutes, Duration: 30 seconds and Intensity:  moderate Dilation: 2.5 Effacement (%): 60 Station: -2 Exam by:: L.Mears,RN   Prenatal labs: ABO, Rh: O/POS/-- (04/21 0904) Antibody: NEG (04/21 0904) Rubella:   RPR: NON REAC (09/08 0935)  HBsAg: NEGATIVE (04/21 0904)  HIV: NONREACTIVE (09/08 0935)  GBS:    1 hr Glucola 132 Genetic screening  DSR 1:232, MSAFP neg Anatomy US Normal   Prenatal Transfer Tool  Maternal Diabetes: No Genetic Screening: Abnormal:  Results: Elevated AFP Maternal Ultrasounds/Referrals: Normal Fetal Ultrasounds or other Referrals: none Maternal Substance Abuse:  Yes:  Type: Smoker Significant Maternal Medications:  Meds include: Other: Fioricet, tylenol Significant Maternal Lab Results: Lab values include: Other: GBS unknown     Results for orders placed or performed during the hospital encounter of 12/08/13 (from the past 24 hour(s))  CBC   Collection Time: 12/08/13  2:30 AM  Result Value Ref Range   WBC 10.6 (H) 4.0 - 10.5 K/uL   RBC 4.42 3.87 - 5.11 MIL/uL   Hemoglobin 12.8 12.0 - 15.0 g/dL   HCT 37.7 36.0 - 46.0 %   MCV 85.3 78.0 - 100.0 fL   MCH 29.0 26.0 - 34.0 pg   MCHC 34.0 30.0 - 36.0 g/dL   RDW 14.7 11.5 - 15.5 %   Platelets 276 150 - 400 K/uL  Results for orders placed or performed during the hospital encounter of 12/07/13 (from the past 24 hour(s))  Protein / creatinine ratio, urine   Collection Time: 12/07/13 10:52 AM  Result Value Ref Range   Creatinine, Urine 254.39 mg/dL   Total Protein, Urine 44.4 mg/dL   Protein Creatinine Ratio 0.17 (H) 0.00 - 0.15  Urinalysis, Routine w reflex microscopic   Collection Time: 12/07/13 10:52 AM  Result Value Ref Range   Color, Urine YELLOW YELLOW   APPearance CLEAR CLEAR   Specific Gravity, Urine 1.025 1.005 - 1.030   pH 6.5 5.0 - 8.0   Glucose, UA NEGATIVE NEGATIVE mg/dL   Hgb urine dipstick NEGATIVE NEGATIVE   Bilirubin Urine NEGATIVE NEGATIVE   Ketones, ur NEGATIVE NEGATIVE mg/dL   Protein, ur NEGATIVE NEGATIVE mg/dL    Urobilinogen, UA 0.2 0.0 - 1.0 mg/dL   Nitrite NEGATIVE NEGATIVE   Leukocytes, UA NEGATIVE NEGATIVE  CBC   Collection Time: 12/07/13 12:16 PM  Result Value Ref Range   WBC 8.3 4.0 - 10.5 K/uL   RBC 4.17 3.87 - 5.11 MIL/uL   Hemoglobin 11.8 (L) 12.0 - 15.0 g/dL   HCT 36.1 36.0 - 46.0 %   MCV 86.6 78.0 - 100.0 fL   MCH 28.3 26.0 - 34.0 pg   MCHC 32.7 30.0 - 36.0 g/dL   RDW 14.8 11.5 - 15.5 %   Platelets 252 150 - 400 K/uL  Comprehensive metabolic  panel   Collection Time: 12/07/13 12:16 PM  Result Value Ref Range   Sodium 135 (L) 137 - 147 mEq/L   Potassium 4.2 3.7 - 5.3 mEq/L   Chloride 101 96 - 112 mEq/L   CO2 24 19 - 32 mEq/L   Glucose, Bld 97 70 - 99 mg/dL   BUN 9 6 - 23 mg/dL   Creatinine, Ser 0.71 0.50 - 1.10 mg/dL   Calcium 8.8 8.4 - 10.5 mg/dL   Total Protein 5.8 (L) 6.0 - 8.3 g/dL   Albumin 2.3 (L) 3.5 - 5.2 g/dL   AST 12 0 - 37 U/L   ALT 14 0 - 35 U/L   Alkaline Phosphatase 431 (H) 39 - 117 U/L   Total Bilirubin <0.2 (L) 0.3 - 1.2 mg/dL   GFR calc non Af Amer >90 >90 mL/min   GFR calc Af Amer >90 >90 mL/min   Anion gap 10 5 - 15  Uric acid   Collection Time: 12/07/13 12:16 PM  Result Value Ref Range   Uric Acid, Serum 4.6 2.4 - 7.0 mg/dL  Lactate dehydrogenase   Collection Time: 12/07/13 12:16 PM  Result Value Ref Range   LDH 147 94 - 250 U/L  Results for orders placed or performed in visit on 12/07/13 (from the past 24 hour(s))  POCT urinalysis dip (device)   Collection Time: 12/07/13  8:57 AM  Result Value Ref Range   Glucose, UA NEGATIVE NEGATIVE mg/dL   Bilirubin Urine NEGATIVE NEGATIVE   Ketones, ur NEGATIVE NEGATIVE mg/dL   Specific Gravity, Urine 1.015 1.005 - 1.030   Hgb urine dipstick NEGATIVE NEGATIVE   pH 6.5 5.0 - 8.0   Protein, ur 30 (A) NEGATIVE mg/dL   Urobilinogen, UA 0.2 0.0 - 1.0 mg/dL   Nitrite NEGATIVE NEGATIVE   Leukocytes, UA NEGATIVE NEGATIVE    Patient Active Problem List   Diagnosis Date Noted  . Elevated blood pressure  11/30/2013  . Abnormal findings on antenatal screening   . Cigarette smoker   . High risk pregnancy with low PAPPA   . [redacted] weeks gestation of pregnancy   . Palpitations 10/29/2013  . PVC's (premature ventricular contractions) 10/29/2013  . SOB (shortness of breath) 10/29/2013  . Abnormal chromosomal and genetic finding on antenatal screening of mother 09/22/2013  . History of preterm delivery, currently pregnant 06/24/2013  . Supervision of other normal pregnancy 06/12/2013  . Smoker 05/27/2013  . HSV-2 (herpes simplex virus 2) infection 05/27/2013  . History of abnormal cervical Pap smear 05/27/2013  . History of maternal chlamydia infection, currently pregnant 05/27/2013  . Previous cesarean delivery affecting pregnancy, antepartum 05/27/2013  . History of incompetent cervix, currently pregnant in first trimester 05/27/2013    Assessment: Erika Gonzales is a 33 y.o. [id number] at [redacted]w[redacted]d here for contractions, but found to have Cat II tracing in MAU with repetitive late decelerations and thus admitted for repeat LTCS.   #Labor: Does not appear to be in active labor with cervical change from 2--> 2.5 over 1 hour. However, given painful contractions and repetive late decels, will give terbutiline 0.25 x1 - plan for c-section, repeat - Consented by Dr. Nehemiah Settle  #FWB: Cat II, due to repetitive late decels. Will give terbutiline 0.25 x 1 #ID:  GBS unknown, HSV-2 on ppx #MOF: Breast+bottle #MOC:BTL, consented. Will receive during rLTCS #Circ:  It's a girl!  Tula Nakayama 12/08/2013, 3:10 AM

## 2013-12-08 NOTE — Anesthesia Postprocedure Evaluation (Signed)
  Anesthesia Post-op Note  Anesthesia Post Note  Patient: Erika Gonzales  Procedure(s) Performed: Procedure(s) (LRB): CESAREAN SECTION WITH BILATERAL TUBAL LIGATION (N/A)  Anesthesia type: Spinal  Patient location: Mother/Baby  Post pain: Pain level controlled  Post assessment: Post-op Vital signs reviewed  Last Vitals:  Filed Vitals:   12/08/13 1631  BP: 115/52  Pulse: 42  Temp:   Resp: 18    Post vital signs: Reviewed  Level of consciousness: awake  Complications: No apparent anesthesia complications

## 2013-12-08 NOTE — Anesthesia Postprocedure Evaluation (Signed)
  Anesthesia Post-op Note Anesthesia Post Note  Patient: Erika Gonzales  Procedure(s) Performed: Procedure(s) (LRB): CESAREAN SECTION WITH BILATERAL TUBAL LIGATION (N/A)  Anesthesia type: Spinal  Patient location: PACU  Post pain: Pain level controlled  Post assessment: Post-op Vital signs reviewed  Last Vitals:  Filed Vitals:   12/08/13 0715  BP:   Pulse: 47  Temp:   Resp: 28    Post vital signs: Reviewed  Level of consciousness: awake  Complications: No apparent anesthesia complications

## 2013-12-08 NOTE — Op Note (Signed)
Erika Gonzales PROCEDURE DATE: 12/08/2013  PREOPERATIVE DIAGNOSIS: Intrauterine pregnancy at  [redacted]w[redacted]d weeks gestation; non-reassuring fetal status and previous uterine incision kerr x2, undesired fertility  POSTOPERATIVE DIAGNOSIS: The same  PROCEDURE: Repeat Low Transverse Cesarean Section  SURGEON:  Dr. Loma Boston  ASSISTANT: none  INDICATIONS: Erika Gonzales is a 33 y.o. [id number] at [redacted]w[redacted]d scheduled for cesarean section secondary to non-reassuring fetal status and previous uterine incision kerr x2, undesired fertility.  The risks of cesarean section discussed with the patient included but were not limited to: bleeding which may require transfusion or reoperation; infection which may require antibiotics; injury to bowel, bladder, ureters or other surrounding organs; injury to the fetus; need for additional procedures including hysterectomy in the event of a life-threatening hemorrhage; placental abnormalities wth subsequent pregnancies, incisional problems, thromboembolic phenomenon and other postoperative/anesthesia complications. The patient concurred with the proposed plan, giving informed written consent for the procedure.    FINDINGS:  Viable female infant in cephalic presentation, stained with meconium.  Apgars 8 and 9, weight, 5 pounds and 5 ounces.  Clear amniotic fluid.  Intact placenta, three vessel cord.  Normal uterus, fallopian tubes and ovaries bilaterally.  ANESTHESIA:    Spinal INTRAVENOUS FLUIDS:1500 ml ESTIMATED BLOOD LOSS: 250 ml URINE OUTPUT:  200 ml SPECIMENS: Placenta sent to pathology COMPLICATIONS: None immediate  PROCEDURE IN DETAIL:  The patient received intravenous antibiotics and had sequential compression devices applied to her lower extremities while in the preoperative area.  She was then taken to the operating room where spinal anesthesia was administered and was found to be adequate. She was then placed in a dorsal supine position with a leftward  tilt, and prepped and draped in a sterile manner.  A foley catheter was placed into her bladder and attached to constant gravity, which drained clear fluid throughout.  After an adequate timeout was performed, a Pfannenstiel skin incision was made with scalpel and carried through to the underlying layer of fascia. The fascia was incised in the midline and this incision was extended bilaterally using the Mayo scissors. Kocher clamps were applied to the superior aspect of the fascial incision and the underlying rectus muscles were dissected off bluntly. A similar process was carried out on the inferior aspect of the facial incision. The rectus muscles were separated in the midline bluntly and the peritoneum was entered bluntly. The uterus was adhered to the anterior abdominal wall on the left side and the lower right, which were partially taken down.  A bladder blade was inserted.  Attention was turned to the lower uterine segment where a transverse hysterotomy was made with a scalpel and extended bilaterally bluntly. Thick meconium was noted. The infant was successfully delivered, and cord was clamped and cut and infant was handed over to awaiting neonatology team. Uterine massage was then administered and the placenta delivered intact with three-vessel cord. The uterus was then cleared of clot and debris.  The hysterotomy was closed with 0 Vicryl in a running locked fashion, and an imbricating layer was also placed with a 0 Vicryl. Overall, excellent hemostasis was noted. The adhesions that were taken down were also hemostatic.  The abdomen and the pelvis were cleared of all clot and debris.   Attention was then turned to the fallopian tubes.  A Filshie clip was placed on both tubes, about 2 cm from the cornua, with care given to incorporate the underlying mesosalpinx on both sides, allowing for bilateral tubal sterilization.  Hemostasis was confirmed on all surfaces.  The fascia was then closed using 0 Vicryl in a  running fashion.  The skin was closed with 4-0 vicryl. The patient tolerated the procedure well. Sponge, lap, instrument and needle counts were correct x 2. She was taken to the recovery room in stable condition.    STINSON, JACOB JEHIEL DO 12/08/2013 5:30 AM

## 2013-12-09 ENCOUNTER — Encounter (HOSPITAL_COMMUNITY): Payer: Self-pay | Admitting: *Deleted

## 2013-12-09 ENCOUNTER — Other Ambulatory Visit (HOSPITAL_COMMUNITY): Payer: 59

## 2013-12-09 LAB — CBC
HEMATOCRIT: 32.9 % — AB (ref 36.0–46.0)
HEMOGLOBIN: 10.7 g/dL — AB (ref 12.0–15.0)
MCH: 28.2 pg (ref 26.0–34.0)
MCHC: 32.5 g/dL (ref 30.0–36.0)
MCV: 86.8 fL (ref 78.0–100.0)
Platelets: 218 10*3/uL (ref 150–400)
RBC: 3.79 MIL/uL — AB (ref 3.87–5.11)
RDW: 15 % (ref 11.5–15.5)
WBC: 8.3 10*3/uL (ref 4.0–10.5)

## 2013-12-09 NOTE — Progress Notes (Signed)
Pt came in for a blood pressure check.  BP WNL.  Informed Dr. Roselie Awkward of results and pt is going to be induced on 12/09/13, no new orders just continue to monitor.  Pt stated understanding with no further questions.

## 2013-12-09 NOTE — Plan of Care (Signed)
Problem: Phase I Progression Outcomes Goal: Voiding adequately Outcome: Completed/Met Date Met:  12/09/13     

## 2013-12-09 NOTE — Plan of Care (Signed)
Problem: Discharge Progression Outcomes Goal: Tolerating diet Outcome: Completed/Met Date Met:  12/09/13 Goal: Pain controlled with appropriate interventions Outcome: Completed/Met Date Met:  12/09/13     

## 2013-12-09 NOTE — Progress Notes (Signed)
Subjective: Postpartum Day 1: Cesarean Delivery Patient reports incisional pain, tolerating PO and no problems voiding.    Objective: Vital signs in last 24 hours: Temp:  [97.6 F (36.4 C)-98.3 F (36.8 C)] 98.3 F (36.8 C) (11/25 0420) Pulse Rate:  [40-85] 56 (11/25 0420) Resp:  [16-20] 18 (11/25 0420) BP: (75-143)/(43-73) 115/73 mmHg (11/25 0420) SpO2:  [95 %-99 %] 97 % (11/25 0420)  Physical Exam:  General: alert, cooperative, appears stated age and no distress Lochia: appropriate Uterine Fundus: firm Incision: healing well DVT Evaluation: No evidence of DVT seen on physical exam. Negative Homan's sign.   Recent Labs  12/08/13 0230 12/09/13 0539  HGB 12.8 10.7*  HCT 37.7 32.9*    Assessment/Plan: Status post Cesarean section. Doing well postoperatively.  Continue current care.  Gevena Barre, [place]   12/09/2013, 9:13 AM

## 2013-12-10 MED ORDER — IBUPROFEN 600 MG PO TABS: 600.0000 mg | ORAL_TABLET | Freq: Four times a day (QID) | ORAL | Status: DC

## 2013-12-10 MED ORDER — OXYCODONE-ACETAMINOPHEN 5-325 MG PO TABS: 1.0000 | ORAL_TABLET | ORAL | Status: DC | PRN

## 2013-12-10 NOTE — Plan of Care (Signed)
Problem: Discharge Progression Outcomes Goal: Barriers To Progression Addressed/Resolved Outcome: Completed/Met Date Met:  12/10/13 Goal: Activity appropriate for discharge plan Outcome: Completed/Met Date Met:  99/24/26 Goal: Complications resolved/controlled Outcome: Completed/Met Date Met:  12/10/13

## 2013-12-10 NOTE — Plan of Care (Signed)
Problem: Discharge Progression Outcomes Goal: Afebrile, VS remain stable at discharge Outcome: Completed/Met Date Met:  12/10/13

## 2013-12-10 NOTE — Discharge Instructions (Signed)

## 2013-12-10 NOTE — Discharge Summary (Signed)
Obstetric Discharge Summary Reason for Admission: cesarean section Repeat and BTL Prenatal Procedures: ultrasound Intrapartum Procedures: cesarean: low cervical, transverse Postpartum Procedures: none Complications-Operative and Postpartum: none   Assessment: HERA CELAYA is a 33 y.o. [id number] at [redacted]w[redacted]d here for contractions, but found to have Cat II tracing in MAU with repetitive late decelerations and thus admitted for repeat LTCS.   #Labor: Does not appear to be in active labor with cervical change from 2--> 2.5 over 1 hour. However, given painful contractions and repetive late decels, will give terbutiline 0.25 x1 - plan for c-section, repeat - Consented by Dr. Nehemiah Settle  HEMOGLOBIN  Date Value Ref Range Status  12/09/2013 10.7* 12.0 - 15.0 g/dL Final  05/05/2013 13.1 g/dL Final   HCT  Date Value Ref Range Status  12/09/2013 32.9* 36.0 - 46.0 % Final  05/05/2013 39 % Final    Physical Exam:  General: alert, cooperative and no distress Lochia: appropriate Uterine Fundus: firm Incision: healing well, no significant drainage DVT Evaluation: No evidence of DVT seen on physical exam.  Discharge Diagnoses: Term Pregnancy-delivered  Discharge Information: Date: 12/10/2013 Activity: pelvic rest Diet: routine Medications: PNV, Ibuprofen and Percocet Condition: stable Instructions: refer to practice specific booklet Discharge to: home Follow-up Information    Follow up with [facility]. Schedule an appointment as soon as possible for a visit in 4 weeks.   Why:  for postpartum checkup      Newborn Data: Live born female  Birth Weight: 5 lb 5 oz (2410 g) APGAR: 8, 9  Home with mother.  CRESENZO-DISHMAN,Ethanjames Fontenot 12/10/2013, 9:36 AM

## 2013-12-10 NOTE — Plan of Care (Signed)
Problem: Discharge Progression Outcomes Goal: Remove staples per MD order Outcome: Not Applicable Date Met:  83/81/84

## 2013-12-10 NOTE — Plan of Care (Signed)
Problem: Discharge Progression Outcomes Goal: Discharge plan in place and appropriate Outcome: Completed/Met Date Met:  12/10/13     

## 2013-12-11 ENCOUNTER — Encounter (HOSPITAL_COMMUNITY): Admission: RE | Payer: 59 | Source: Ambulatory Visit

## 2013-12-11 ENCOUNTER — Inpatient Hospital Stay (HOSPITAL_COMMUNITY): Admission: RE | Admit: 2013-12-11 | Payer: 59 | Source: Ambulatory Visit | Admitting: Obstetrics & Gynecology

## 2013-12-11 SURGERY — Surgical Case
Anesthesia: Regional | Site: Abdomen | Laterality: Bilateral

## 2013-12-15 ENCOUNTER — Encounter: Payer: Self-pay | Admitting: *Deleted

## 2013-12-15 ENCOUNTER — Encounter: Payer: Self-pay | Admitting: Advanced Practice Midwife

## 2013-12-16 ENCOUNTER — Encounter: Payer: Self-pay | Admitting: *Deleted

## 2014-01-14 ENCOUNTER — Encounter: Payer: Self-pay | Admitting: Cardiology

## 2014-01-20 ENCOUNTER — Telehealth: Payer: Self-pay | Admitting: *Deleted

## 2014-01-20 NOTE — Telephone Encounter (Signed)
Pt left message requesting her FMLA papers to be faxed to alternate number [phone] and use claim # [id number] on cover letter.  Called pt and informed her that her request has been completed. She voiced understanding.

## 2014-01-22 ENCOUNTER — Ambulatory Visit: Payer: 59 | Admitting: Medical

## 2014-01-28 ENCOUNTER — Telehealth: Payer: Self-pay

## 2014-01-28 NOTE — Telephone Encounter (Signed)
Dr.Swatchz called and stated that he had a couple of questions concerning pts medical leave.  He asked if there was a medical reasoning for pt to be out of work for 8 weeks.  I informed him that we take pts out of work for 8 weeks for c-section delivery in which pt had on 12/08/13.  The next question he asked what has she been seen in our office since delivery and does she have an appt scheduled.  I informed him that the pt has not had a need as of yet to come into the office since delivery and that she has an postpartum appointment scheduled on 02/08/14.  Dr. Veatrice Bourbon did not have any other questions.

## 2014-02-08 ENCOUNTER — Ambulatory Visit (INDEPENDENT_AMBULATORY_CARE_PROVIDER_SITE_OTHER): Payer: Self-pay | Admitting: Family Medicine

## 2014-02-08 ENCOUNTER — Encounter: Payer: Self-pay | Admitting: Family Medicine

## 2014-02-08 DIAGNOSIS — B372 Candidiasis of skin and nail: Secondary | ICD-10-CM

## 2014-02-08 MED ORDER — NYSTATIN 100000 UNIT/GM EX CREA: 1.0000 "application " | TOPICAL_CREAM | Freq: Two times a day (BID) | CUTANEOUS | Status: DC

## 2014-02-08 NOTE — Patient Instructions (Signed)
Postpartum Depression and Baby Blues The postpartum period begins right after the birth of a baby. During this time, there is often a great amount of joy and excitement. It is also a time of many changes in the life of the parents. Regardless of how many times a mother gives birth, each child brings new challenges and dynamics to the family. It is not unusual to have feelings of excitement along with confusing shifts in moods, emotions, and thoughts. All mothers are at risk of developing postpartum depression or the "baby blues." These mood changes can occur right after giving birth, or they may occur many months after giving birth. The baby blues or postpartum depression can be mild or severe. Additionally, postpartum depression can go away rather quickly, or it can be a long-term condition.  CAUSES Raised hormone levels and the rapid drop in those levels are thought to be a main cause of postpartum depression and the baby blues. A number of hormones change during and after pregnancy. Estrogen and progesterone usually decrease right after the delivery of your baby. The levels of thyroid hormone and various cortisol steroids also rapidly drop. Other factors that play a role in these mood changes include major life events and genetics.  RISK FACTORS If you have any of the following risks for the baby blues or postpartum depression, know what symptoms to watch out for during the postpartum period. Risk factors that may increase the likelihood of getting the baby blues or postpartum depression include:  Having a personal or family history of depression.   Having depression while being pregnant.   Having premenstrual mood issues or mood issues related to oral contraceptives.  Having a lot of life stress.   Having marital conflict.   Lacking a social support network.   Having a baby with special needs.   Having health problems, such as diabetes.  SIGNS AND SYMPTOMS Symptoms of baby blues  include:  Brief changes in mood, such as going from extreme happiness to sadness.  Decreased concentration.   Difficulty sleeping.   Crying spells, tearfulness.   Irritability.   Anxiety.  Symptoms of postpartum depression typically begin within the first month after giving birth. These symptoms include:  Difficulty sleeping or excessive sleepiness.   Marked weight loss.   Agitation.   Feelings of worthlessness.   Lack of interest in activity or food.  Postpartum psychosis is a very serious condition and can be dangerous. Fortunately, it is rare. Displaying any of the following symptoms is cause for immediate medical attention. Symptoms of postpartum psychosis include:   Hallucinations and delusions.   Bizarre or disorganized behavior.   Confusion or disorientation.  DIAGNOSIS  A diagnosis is made by an evaluation of your symptoms. There are no medical or lab tests that lead to a diagnosis, but there are various questionnaires that a health care provider may use to identify those with the baby blues, postpartum depression, or psychosis. Often, a screening tool called the [place] Postnatal Depression Scale is used to diagnose depression in the postpartum period.  TREATMENT The baby blues usually goes away on its own in 1-2 weeks. Social support is often all that is needed. You will be encouraged to get adequate sleep and rest. Occasionally, you may be given medicines to help you sleep.  Postpartum depression requires treatment because it can last several months or longer if it is not treated. Treatment may include individual or group therapy, medicine, or both to address any social, physiological, and psychological  factors that may play a role in the depression. Regular exercise, a healthy diet, rest, and social support may also be strongly recommended.  Postpartum psychosis is more serious and needs treatment right away. Hospitalization is often needed. HOME CARE  INSTRUCTIONS  Get as much rest as you can. Nap when the baby sleeps.   Exercise regularly. Some women find yoga and walking to be beneficial.   Eat a balanced and nourishing diet.   Do little things that you enjoy. Have a cup of tea, take a bubble bath, read your favorite magazine, or listen to your favorite music.  Avoid alcohol.   Ask for help with household chores, cooking, grocery shopping, or running errands as needed. Do not try to do everything.   Talk to people close to you about how you are feeling. Get support from your partner, family members, friends, or other new moms.  Try to stay positive in how you think. Think about the things you are grateful for.   Do not spend a lot of time alone.   Only take over-the-counter or prescription medicine as directed by your health care provider.  Keep all your postpartum appointments.   Let your health care provider know if you have any concerns.  SEEK MEDICAL CARE IF: You are having a reaction to or problems with your medicine. SEEK IMMEDIATE MEDICAL CARE IF:  You have suicidal feelings.   You think you may harm the baby or someone else. MAKE SURE YOU:  Understand these instructions.  Will watch your condition.  Will get help right away if you are not doing well or get worse. Document Released: 10/06/2003 Document Revised: 01/06/2013 Document Reviewed: 10/13/2012 [facility] Patient Information 2015 [place], [place]. This information is not intended to replace advice given to you by your health care provider. Make sure you discuss any questions you have with your health care provider.

## 2014-02-08 NOTE — Progress Notes (Signed)
  Subjective:     Erika Gonzales is a 34 y.o. female who presents for a postpartum visit. She is 5 weeks postpartum following a low cervical transverse Cesarean section. I have fully reviewed the prenatal and intrapartum course. The delivery was at [redacted]w[redacted]d gestational weeks. Outcome: primary cesarean section, low transverse incision. Anesthesia: spinal. Postpartum course has been normal. Baby's course has been normal. Baby is feeding by bottle. Bleeding no bleeding. Bowel function is normal. Bladder function is normal. Patient is not sexually active. Contraception method is tubal ligation. Postpartum depression screening: negative.  The following portions of the patient's history were reviewed and updated as appropriate: allergies, current medications, past family history, past medical history, past social history, past surgical history and problem list.  Review of Systems Pertinent items are noted in HPI.   Objective:    BP 132/70 mmHg  Pulse 67  Temp(Src) 98.3 F (36.8 C) (Oral)  Ht 5\' 6"  (1.676 m)  Wt 188 lb 1.6 oz (85.322 kg)  BMI 30.37 kg/m2  LMP 01/31/2014  Breastfeeding? Yes  General:  alert, cooperative and no distress   Breasts:  mild erythema to lower outer left nipple with burning sensation  Lungs: clear to auscultation bilaterally  Heart:  regular rate and rhythm, S1, S2 normal, no murmur, click, rub or gallop  Abdomen: soft, non-tender; bowel sounds normal; no masses,  no organomegaly.  Incision clean, dry, well healed.  Mild tenderness in left corner        Assessment:     normal postpartum exam. Pap smear not done at today's visit.   Plan:    1. Contraception: tubal ligation 2. Nystatin for yeast skin infection 3. Follow up in: 1 year or as needed.

## 2014-07-12 ENCOUNTER — Other Ambulatory Visit: Payer: Self-pay

## 2014-08-01 ENCOUNTER — Telehealth: Payer: Self-pay | Admitting: Family

## 2014-08-01 DIAGNOSIS — M5441 Lumbago with sciatica, right side: Secondary | ICD-10-CM

## 2014-08-01 MED ORDER — ETODOLAC 300 MG PO CAPS: 300.0000 mg | ORAL_CAPSULE | Freq: Two times a day (BID) | ORAL | Status: DC

## 2014-08-01 MED ORDER — CYCLOBENZAPRINE HCL 10 MG PO TABS: 10.0000 mg | ORAL_TABLET | Freq: Three times a day (TID) | ORAL | Status: AC | PRN

## 2014-08-01 NOTE — Progress Notes (Signed)
We are sorry that you are not feeling well.  Here is how we plan to help!  Based on what you have shared with me it looks like you mostly have acute back pain.  Acute back pain is defined as musculoskeletal pain that can resolve in 1-3 weeks with conservative treatment.  I have prescribed Etodolac 300 mg twice a day non-steroid anti-inflammatory (NSAID) as well as Flexeril 10 mg every eight hours as needed which is a muscle relaxer.  Some patients experience stomach irritation or in increased heartburn with anti-inflammatory drugs.  Please keep in mind that muscle relaxer's can cause fatigue and should not be taken while at work or driving.  Back pain is very common.  The pain often gets better over time.  The cause of back pain is usually not dangerous.  Most people can learn to manage their back pain on their own.  Home Care  Stay active.  Start with short walks on flat ground if you can.  Try to walk farther each day.  Do not sit, drive or stand in one place for more than 30 minutes.  Do not stay in bed.  Do not avoid exercise or work.  Activity can help your back heal faster.  Be careful when you bend or lift an object.  Bend at your knees, keep the object close to you, and do not twist.  Sleep on a firm mattress.  Lie on your side, and bend your knees.  If you lie on your back, put a pillow under your knees.  Only take medicines as told by your doctor.  Put ice on the injured area.  Put ice in a plastic bag  Place a towel between your skin and the bag  Leave the ice on for 15-20 minutes, 3-4 times a day for the first 2-3 days.  After that, you can switch between ice and heat packs.  Ask your doctor about back exercises or massage.  Avoid feeling anxious or stressed.  Find good ways to deal with stress, such as exercise.  Get Help Right Way If:  Your pain does not go away with rest or medicine.  Your pain does not go away in 1 week.  You have new problems.  You do not  feel well.  The pain spreads into your legs.  You cannot control when you poop (bowel movement) or pee (urinate)  You feel sick to your stomach (nauseous) or throw up (vomit)  You have belly (abdominal) pain.  You feel like you may pass out (faint).  If you develop a fever.  Make Sure you:  Understand these instructions.  Will watch your condition  Will get help right away if you are not doing well or get worse.  Your e-visit answers were reviewed by a board certified advanced clinical practitioner to complete your personal care plan.  Depending on the condition, your plan could have included both over the counter or prescription medications.  If there is a problem please reply  once you have received a response from your provider.  Your safety is important to [place].  If you have drug allergies check your prescription carefully.    You can use MyChart to ask questions about today's visit, request a non-urgent call back, or ask for a work or school excuse.  You will get an e-mail in the next two days asking about your experience.  I hope that your e-visit has been valuable and will speed your recovery. Thank you  for using e-visits.

## 2014-10-29 ENCOUNTER — Ambulatory Visit
Admission: RE | Admit: 2014-10-29 | Discharge: 2014-10-29 | Disposition: A | Discharge status: Home / Self Care | Payer: 59 | Source: Ambulatory Visit | Attending: Family Medicine | Admitting: Family Medicine

## 2014-10-29 ENCOUNTER — Other Ambulatory Visit: Payer: Self-pay | Admitting: Family Medicine

## 2014-10-29 DIAGNOSIS — M545 Low back pain: Secondary | ICD-10-CM

## 2015-04-19 ENCOUNTER — Encounter (HOSPITAL_COMMUNITY): Payer: Self-pay

## 2015-04-19 ENCOUNTER — Emergency Department (HOSPITAL_COMMUNITY)
Admission: EM | Admit: 2015-04-19 | Discharge: 2015-04-19 | Disposition: A | Discharge status: Home / Self Care | Payer: 59 | Attending: Emergency Medicine | Admitting: Emergency Medicine

## 2015-04-19 ENCOUNTER — Emergency Department (HOSPITAL_COMMUNITY): Payer: 59

## 2015-04-19 DIAGNOSIS — F17210 Nicotine dependence, cigarettes, uncomplicated: Secondary | ICD-10-CM | POA: Insufficient documentation

## 2015-04-19 DIAGNOSIS — Z8742 Personal history of other diseases of the female genital tract: Secondary | ICD-10-CM | POA: Insufficient documentation

## 2015-04-19 DIAGNOSIS — Z7982 Long term (current) use of aspirin: Secondary | ICD-10-CM | POA: Insufficient documentation

## 2015-04-19 DIAGNOSIS — Z87440 Personal history of urinary (tract) infections: Secondary | ICD-10-CM | POA: Insufficient documentation

## 2015-04-19 DIAGNOSIS — Z8669 Personal history of other diseases of the nervous system and sense organs: Secondary | ICD-10-CM | POA: Diagnosis not present

## 2015-04-19 DIAGNOSIS — M25512 Pain in left shoulder: Secondary | ICD-10-CM | POA: Diagnosis not present

## 2015-04-19 DIAGNOSIS — Z8619 Personal history of other infectious and parasitic diseases: Secondary | ICD-10-CM | POA: Diagnosis not present

## 2015-04-19 DIAGNOSIS — Z86018 Personal history of other benign neoplasm: Secondary | ICD-10-CM | POA: Diagnosis not present

## 2015-04-19 DIAGNOSIS — R079 Chest pain, unspecified: Secondary | ICD-10-CM | POA: Diagnosis present

## 2015-04-19 HISTORY — DX: Serous retinal detachment, unspecified eye: H33.20

## 2015-04-19 LAB — CBC
HCT: 38.9 % (ref 36.0–46.0)
Hemoglobin: 12.6 g/dL (ref 12.0–15.0)
MCH: 27.6 pg (ref 26.0–34.0)
MCHC: 32.4 g/dL (ref 30.0–36.0)
MCV: 85.3 fL (ref 78.0–100.0)
PLATELETS: 419 10*3/uL — AB (ref 150–400)
RBC: 4.56 MIL/uL (ref 3.87–5.11)
RDW: 13.7 % (ref 11.5–15.5)
WBC: 6.5 10*3/uL (ref 4.0–10.5)

## 2015-04-19 LAB — BASIC METABOLIC PANEL
Anion gap: 5 (ref 5–15)
BUN: 13 mg/dL (ref 6–20)
CHLORIDE: 107 mmol/L (ref 101–111)
CO2: 28 mmol/L (ref 22–32)
CREATININE: 0.74 mg/dL (ref 0.44–1.00)
Calcium: 9.3 mg/dL (ref 8.9–10.3)
GFR calc Af Amer: >60 mL/min (ref >60)
GFR calc non Af Amer: >60 mL/min (ref >60)
GLUCOSE: 80 mg/dL (ref 65–99)
Potassium: 4.2 mmol/L (ref 3.5–5.1)
Sodium: 140 mmol/L (ref 135–145)

## 2015-04-19 LAB — I-STAT TROPONIN, ED: Troponin i, poc: 0 ng/mL (ref 0.00–0.08)

## 2015-04-19 NOTE — ED Provider Notes (Signed)
CSN: [id number]     Arrival date & time 04/19/15  0909 History   First MD Initiated Contact with Patient 04/19/15 1001     Chief Complaint  Patient presents with  . Chest Pain    Patient is a 35 y.o. female presenting with chest pain. The history is provided by the patient.  Chest Pain Associated symptoms: no abdominal pain, no back pain, no headache, no nausea, no numbness, no shortness of breath, not vomiting and no weakness   Patient presents with chest pain. She's had it for the last few days. States she began to have some what she consider reflux in the neck cleared up. She then began to have pain up in her left shoulder area. Goes up her neck and down the arm. Not associated with exertion. She's had constantly for last couple days but will come and go in severity. States it'll come on randomly and hurt more for about 15 minutes and improved some. No numbness or weakness. No headache. No pain with exertion. She does smoke cigarettes. Her father had heart attack at 64 years old. Patient does not have any known cardiac disease. No diaphoresis. No cough. No swelling in her legs.  Past Medical History  Diagnosis Date  . HSV-2 infection   . Fibroid   . Headache(784.0)   . Ovarian cyst   . Abnormal Pap smear     HPV  . Infection     UTI, chlamydia  . Detached retina    Past Surgical History  Procedure Laterality Date  . Cesarean section    . Tonsillectomy    . Adnoidectomy    . Myomectomy    . Cesarean section with bilateral tubal ligation N/A 12/08/2013    Procedure: CESAREAN SECTION WITH BILATERAL TUBAL LIGATION;  Surgeon: Truett Mainland, DO;  Location: [facility];  Service: Obstetrics;  Laterality: N/A;   Family History  Problem Relation Age of Onset  . Hypertension Mother   . Diabetes Mother   . Diabetes Father   . Heart disease Father    Social History  Substance Use Topics  . Smoking status: Current Every Day Smoker -- 0.50 packs/day for 12 years    Types: Cigarettes  .  Smokeless tobacco: Never Used  . Alcohol Use: Yes     Comment: weekends; not with preg   OB History    Gravida Para Term Preterm AB TAB SAB Ectopic Multiple Living   5 3 2 1 2  0 2 0 0 3     Review of Systems  Constitutional: Negative for activity change and appetite change.  Eyes: Negative for pain.  Respiratory: Negative for chest tightness and shortness of breath.   Cardiovascular: Positive for chest pain. Negative for leg swelling.  Gastrointestinal: Negative for nausea, vomiting, abdominal pain and diarrhea.  Genitourinary: Negative for flank pain.  Musculoskeletal: Negative for back pain and neck stiffness.  Skin: Negative for rash.  Neurological: Negative for weakness, numbness and headaches.  Psychiatric/Behavioral: Negative for behavioral problems.      Allergies  Citrus and Banana  Home Medications   Prior to Admission medications   Medication Sig Start Date End Date Taking? Authorizing Provider  aspirin 325 MG tablet Take 650 mg by mouth every 6 (six) hours as needed for mild pain, moderate pain, fever or headache.   Yes Historical Provider, MD  loratadine (CLARITIN) 10 MG tablet Take 10 mg by mouth daily as needed for allergies.   Yes Historical Provider, MD  cyclobenzaprine (  FLEXERIL) 10 MG tablet Take 1 tablet (10 mg total) by mouth 3 (three) times daily as needed for muscle spasms. Patient not taking: Reported on 04/19/2015 08/01/14   Sharion Balloon, FNP   BP 115/75 mmHg  Pulse 74  Temp(Src) 98 F (36.7 C) (Oral)  Resp 14  Ht 5\' 6"  (1.676 m)  Wt 180 lb (81.647 kg)  BMI 29.07 kg/m2  SpO2 100%  LMP 04/09/2015 Physical Exam  Constitutional: She is oriented to person, place, and time. She appears well-developed and well-nourished.  HENT:  Head: Normocephalic and atraumatic.  Eyes: EOM are normal. Pupils are equal, round, and reactive to light.  Neck: Normal range of motion. Neck supple.  Cardiovascular: Normal rate, regular rhythm and normal heart sounds.    No murmur heard. Pulmonary/Chest: Effort normal and breath sounds normal. No respiratory distress. She has no wheezes. She has no rales.  Abdominal: Soft. Bowel sounds are normal. She exhibits no distension. There is no tenderness. There is no rebound and no guarding.  Musculoskeletal: Normal range of motion. She exhibits tenderness.  Tenderness over trapezius on left side. Some pain with arms held out and me attempting to push them down. No chest tenderness. No rash. Neurovascular intact in bilateral upper extremities.  Neurological: She is alert and oriented to person, place, and time. No cranial nerve deficit.  Skin: Skin is warm and dry.  Psychiatric: She has a normal mood and affect. Her speech is normal.  Nursing note and vitals reviewed.   ED Course  Procedures (including critical care time) Labs Review Labs Reviewed  CBC - Abnormal; Notable for the following:    Platelets 419 (*)    All other components within normal limits  BASIC METABOLIC PANEL  I-STAT TROPOININ, ED    Imaging Review Dg Chest 2 View  04/19/2015  CLINICAL DATA:  35 year old female with left chest pain radiating to the left neck and arm. Shortness of breath. Smoker. Initial encounter. EXAM: CHEST  2 VIEW COMPARISON:  04/23/2011 and earlier. FINDINGS: Lung volumes remain normal. Normal cardiac size and mediastinal contours. Visualized tracheal air column is within normal limits. No pneumothorax, pulmonary edema, pleural effusion or abnormal pulmonary opacity. Mild scoliosis. No acute osseous abnormality identified. IMPRESSION: Negative, no acute cardiopulmonary abnormality. Electronically Signed   By: Genevie Ann M.D.   On: 04/19/2015 09:59   I have personally reviewed and evaluated these images and lab results as part of my medical decision-making.   EKG Interpretation   Date/Time:  Tuesday April 19 2015 09:22:21 EDT Ventricular Rate:  78 PR Interval:  218 QRS Duration: 84 QT Interval:  377 QTC Calculation:  429 R Axis:   71 Text Interpretation:  Sinus rhythm Prolonged PR interval Consider left  atrial enlargement Confirmed by Alvino Chapel  MD, Dally Oshel [phone]) on 04/19/2015  10:02:05 AM      MDM   Final diagnoses:  Chest pain, unspecified chest pain type    Patient with chest pain. EKG and x-ray reassuring. Negative troponin. Doubt pulmonary embolism. Likely musculoskeletal. Will discharge home to follow-up as needed.    Davonna Belling, MD 04/19/15 1145

## 2015-04-19 NOTE — ED Notes (Signed)
Patient transported to X-ray 

## 2015-04-19 NOTE — ED Notes (Signed)
Discharge instructions and follow up instructions reviewed with patient. Patient verbalized understanding.

## 2015-04-19 NOTE — Discharge Instructions (Signed)
Nonspecific Chest Pain  °Chest pain can be caused by many different conditions. There is always a chance that your pain could be related to something serious, such as a heart attack or a blood clot in your lungs. Chest pain can also be caused by conditions that are not life-threatening. If you have chest pain, it is very important to follow up with your health care provider. °CAUSES  °Chest pain can be caused by: °· Heartburn. °· Pneumonia or bronchitis. °· Anxiety or stress. °· Inflammation around your heart (pericarditis) or lung (pleuritis or pleurisy). °· A blood clot in your lung. °· A collapsed lung (pneumothorax). It can develop suddenly on its own (spontaneous pneumothorax) or from trauma to the chest. °· Shingles infection (varicella-zoster virus). °· Heart attack. °· Damage to the bones, muscles, and cartilage that make up your chest wall. This can include: °¨ Bruised bones due to injury. °¨ Strained muscles or cartilage due to frequent or repeated coughing or overwork. °¨ Fracture to one or more ribs. °¨ Sore cartilage due to inflammation (costochondritis). °RISK FACTORS  °Risk factors for chest pain may include: °· Activities that increase your risk for trauma or injury to your chest. °· Respiratory infections or conditions that cause frequent coughing. °· Medical conditions or overeating that can cause heartburn. °· Heart disease or family history of heart disease. °· Conditions or health behaviors that increase your risk of developing a blood clot. °· Having had chicken pox (varicella zoster). °SIGNS AND SYMPTOMS °Chest pain can feel like: °· Burning or tingling on the surface of your chest or deep in your chest. °· Crushing, pressure, aching, or squeezing pain. °· Dull or sharp pain that is worse when you move, cough, or take a deep breath. °· Pain that is also felt in your back, neck, shoulder, or arm, or pain that spreads to any of these areas. °Your chest pain may come and go, or it may stay  constant. °DIAGNOSIS °Lab tests or other studies may be needed to find the cause of your pain. Your health care provider may have you take a test called an ambulatory ECG (electrocardiogram). An ECG records your heartbeat patterns at the time the test is performed. You may also have other tests, such as: °· Transthoracic echocardiogram (TTE). During echocardiography, sound waves are used to create a picture of all of the heart structures and to look at how blood flows through your heart. °· Transesophageal echocardiogram (TEE). This is a more advanced imaging test that obtains images from inside your body. It allows your health care provider to see your heart in finer detail. °· Cardiac monitoring. This allows your health care provider to monitor your heart rate and rhythm in real time. °· Holter monitor. This is a portable device that records your heartbeat and can help to diagnose abnormal heartbeats. It allows your health care provider to track your heart activity for several days, if needed. °· Stress tests. These can be done through exercise or by taking medicine that makes your heart beat more quickly. °· Blood tests. °· Imaging tests. °TREATMENT  °Your treatment depends on what is causing your chest pain. Treatment may include: °· Medicines. These may include: °¨ Acid blockers for heartburn. °¨ Anti-inflammatory medicine. °¨ Pain medicine for inflammatory conditions. °¨ Antibiotic medicine, if an infection is present. °¨ Medicines to dissolve blood clots. °¨ Medicines to treat coronary artery disease. °· Supportive care for conditions that do not require medicines. This may include: °¨ Resting. °¨ Applying heat   or cold packs to injured areas. °¨ Limiting activities until pain decreases. °HOME CARE INSTRUCTIONS °· If you were prescribed an antibiotic medicine, finish it all even if you start to feel better. °· Avoid any activities that bring on chest pain. °· Do not use any tobacco products, including  cigarettes, chewing tobacco, or electronic cigarettes. If you need help quitting, ask your health care provider. °· Do not drink alcohol. °· Take medicines only as directed by your health care provider. °· Keep all follow-up visits as directed by your health care provider. This is important. This includes any further testing if your chest pain does not go away. °· If heartburn is the cause for your chest pain, you may be told to keep your head raised (elevated) while sleeping. This reduces the chance that acid will go from your stomach into your esophagus. °· Make lifestyle changes as directed by your health care provider. These may include: °¨ Getting regular exercise. Ask your health care provider to suggest some activities that are safe for you. °¨ Eating a heart-healthy diet. A registered dietitian can help you to learn healthy eating options. °¨ Maintaining a healthy weight. °¨ Managing diabetes, if necessary. °¨ Reducing stress. °SEEK MEDICAL CARE IF: °· Your chest pain does not go away after treatment. °· You have a rash with blisters on your chest. °· You have a fever. °SEEK IMMEDIATE MEDICAL CARE IF:  °· Your chest pain is worse. °· You have an increasing cough, or you cough up blood. °· You have severe abdominal pain. °· You have severe weakness. °· You faint. °· You have chills. °· You have sudden, unexplained chest discomfort. °· You have sudden, unexplained discomfort in your arms, back, neck, or jaw. °· You have shortness of breath at any time. °· You suddenly start to sweat, or your skin gets clammy. °· You feel nauseous or you vomit. °· You suddenly feel light-headed or dizzy. °· Your heart begins to beat quickly, or it feels like it is skipping beats. °These symptoms may represent a serious problem that is an emergency. Do not wait to see if the symptoms will go away. Get medical help right away. Call your local emergency services (911 in the [place] Do not drive yourself to the hospital. °  °This  information is not intended to replace advice given to you by your health care provider. Make sure you discuss any questions you have with your health care provider. °  °Document Released: 10/11/2004 Document Revised: 01/22/2014 Document Reviewed: 08/07/2013 °Elsevier Interactive Patient Education ©2016 [employer] ° °

## 2015-04-19 NOTE — ED Notes (Signed)
Bed: WA08 Expected date:  Expected time:  Means of arrival:  Comments: 

## 2015-04-19 NOTE — ED Notes (Signed)
Patiaent c/o intermittent left chest pain that radiates into the left neck and jaw area x 1 week. Patient also c/o lightheadedness, but denies N/V or SOB.

## 2015-04-19 NOTE — ED Notes (Signed)
MD at bedside. 

## 2015-11-02 IMAGING — US US MFM FETAL NUCHAL TRANSLUCENCY
1 series · 13 of 28 positions shown · non-contrast
Comparison: none

[Series 1: us mfm fetal nuchal translucency · 0.17mm/px · 13 of 33 slices shown]
[im 2/33]
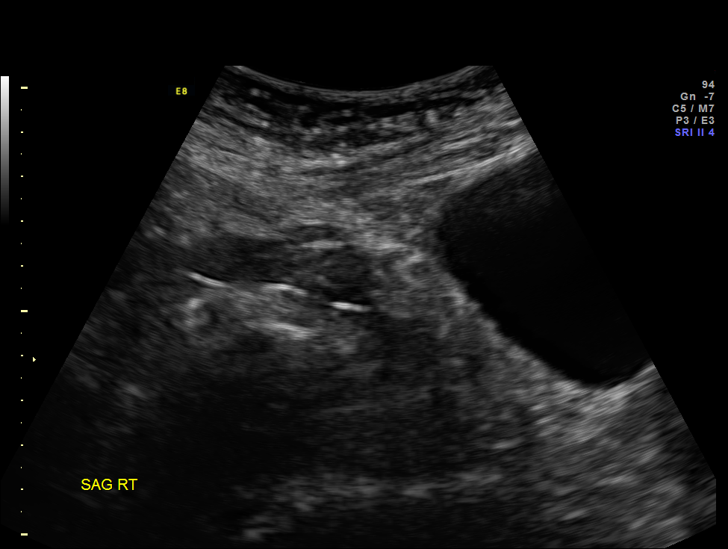
[im 4/33]
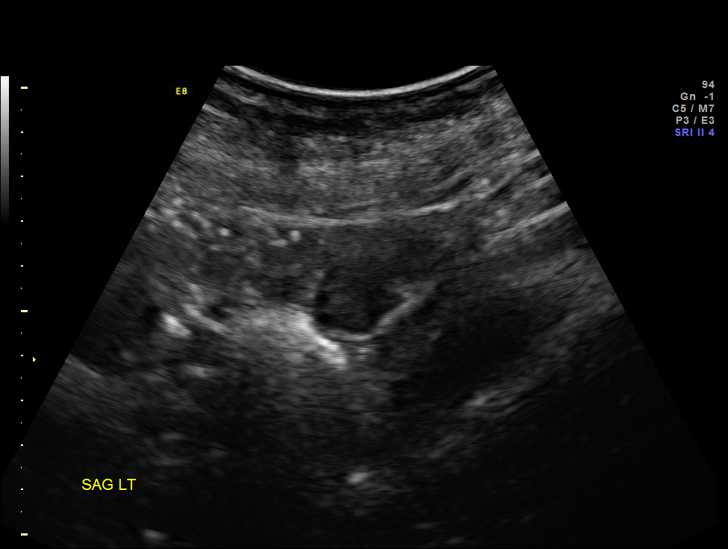
[im 6/33]
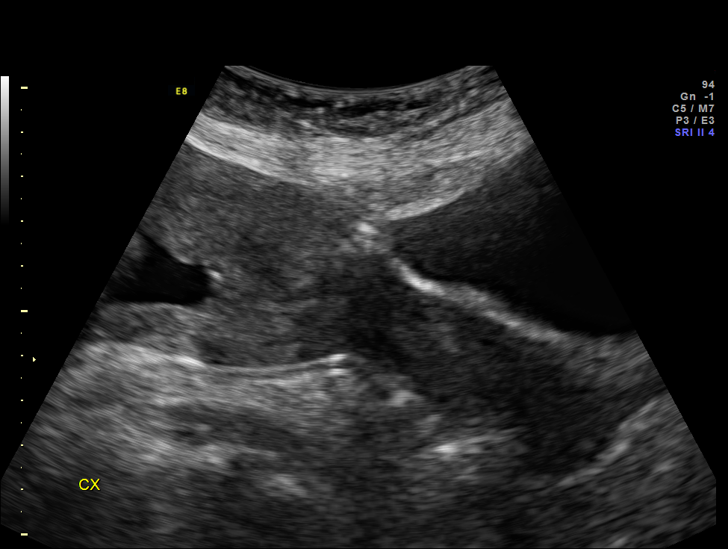
[im 9/33]
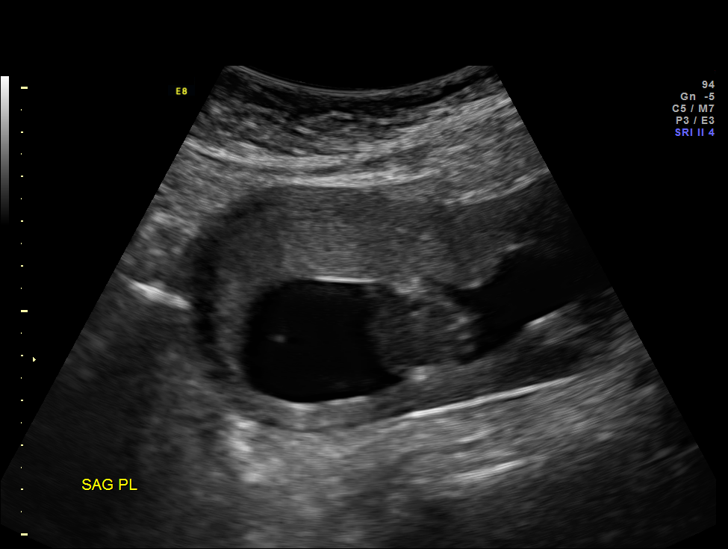
[im 11/33]
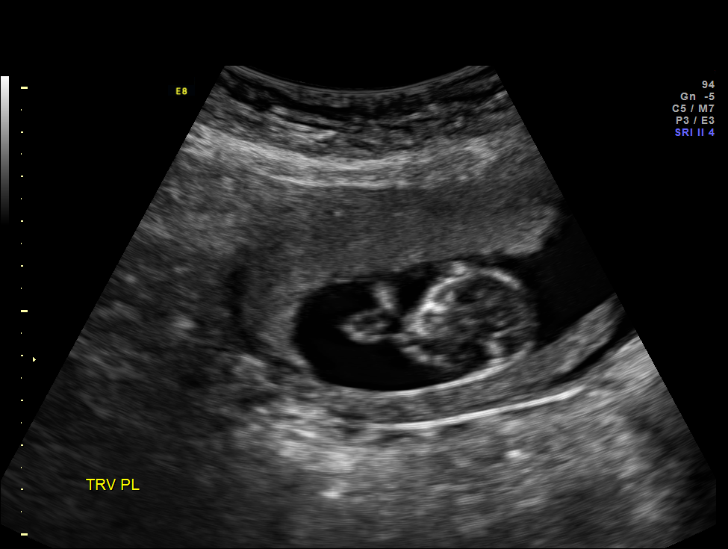
[im 14/33]
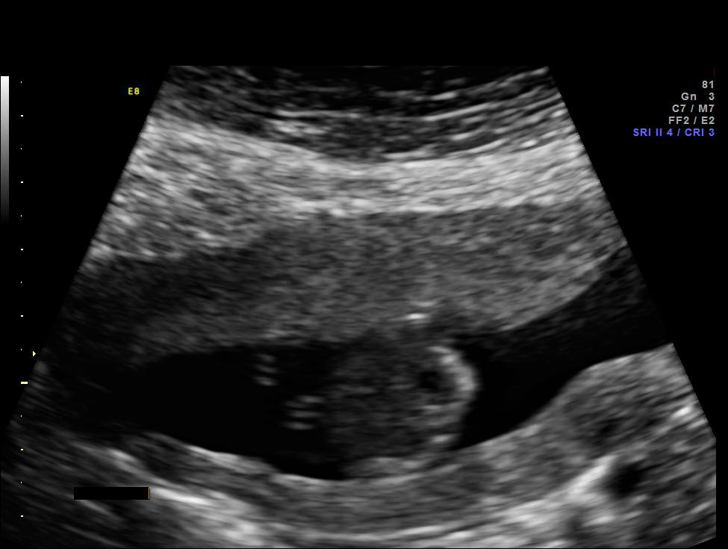
[im 17/33]
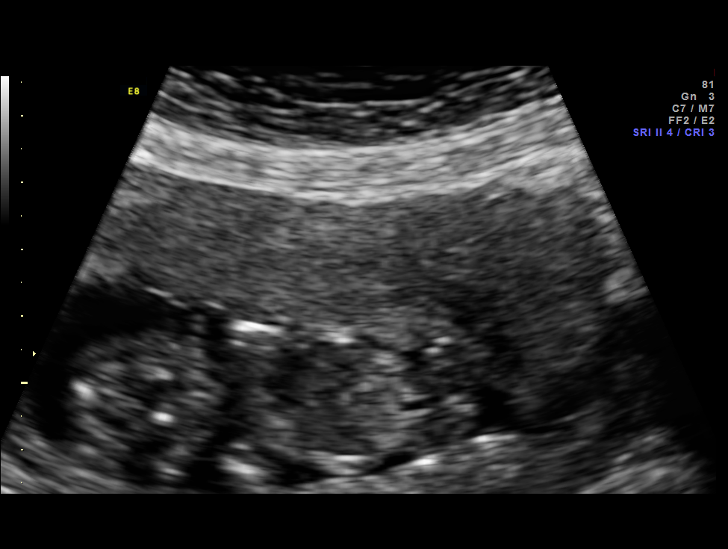
[im 19/33]
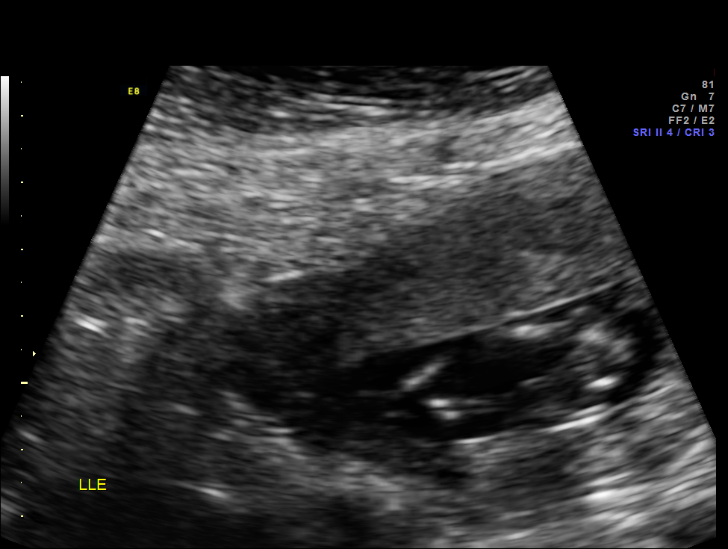
[im 22/33]
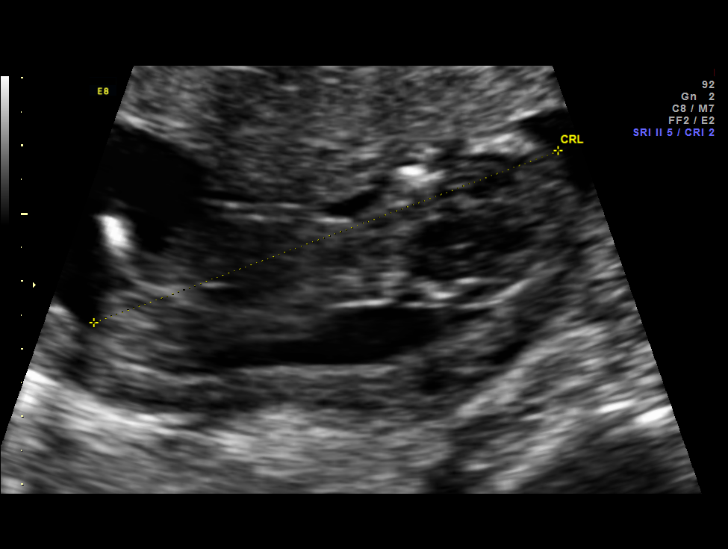
[im 24/33]
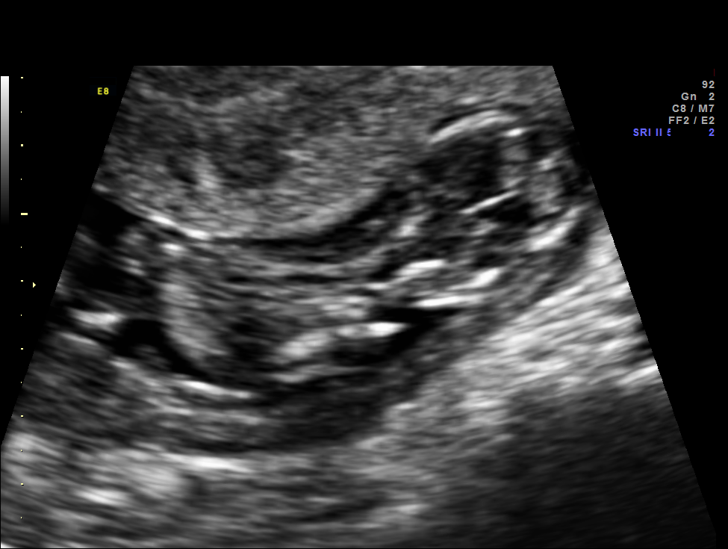
[im 27/33]
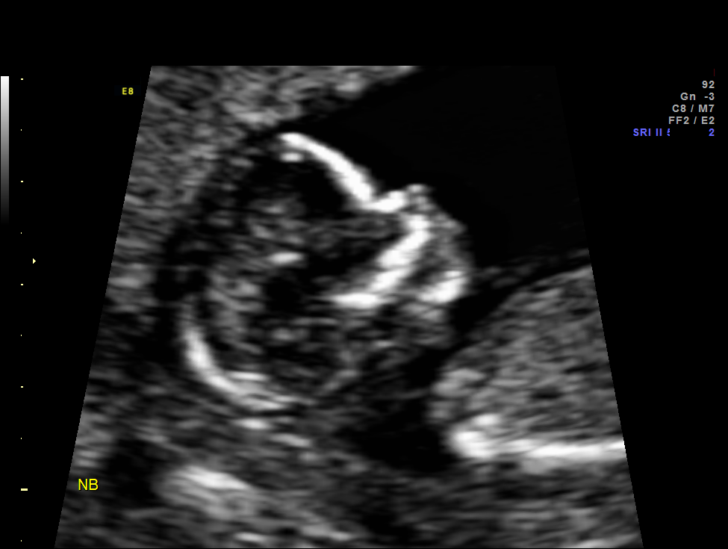
[im 29/33]
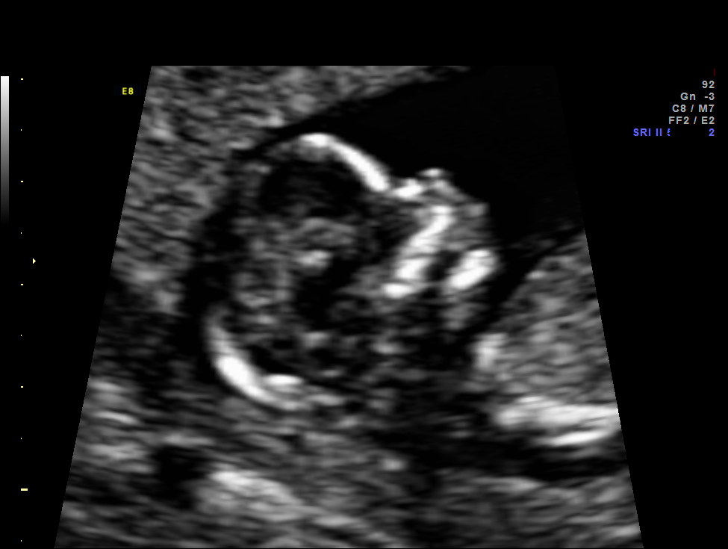
[im 31/33]
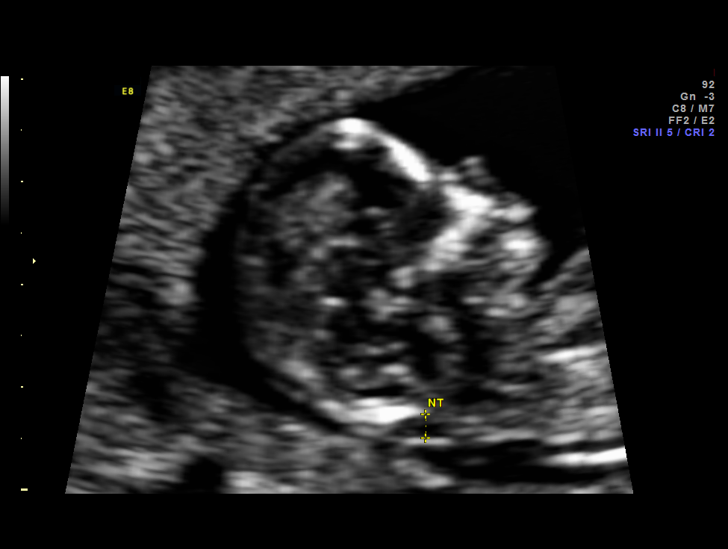

[13 of 28 positions shown; findings below may reference images not displayed]

OBSTETRICS REPORT
                      (Signed Final 06/11/2013 [DATE])

Service(s) Provided

Indications

 First trimester aneuploidy screen (NT)
 Poor obstetric history: Previous preterm
 delivery(32 weeks)
 Cigarette smoker
 Previous cesarean section
Fetal Evaluation

 Num Of Fetuses:    1
 Fetal Heart Rate:  155                          bpm
 Cardiac Activity:  Observed
 Presentation:      Cephalic
 Placenta:          Anterior, above cervical os
 P. Cord            Visualized
 Insertion:

 Amniotic Fluid
 AFI FV:      Subjectively within normal limits
Gestational Age

 LMP:           13w 0d        Date:  03/12/13                 EDD:   12/17/13
 Best:          13w 0d     Det. By:  LMP  (03/12/13)          EDD:   12/17/13
1st Trimester Genetic Sonogram Screening

 CRL:            75.3  mm    G. Age:   13w 2d                 EDD:   12/15/13
 Nuc Trans:       2.3  mm

 Nasal Bone:                 Present
Cervix Uterus Adnexa

 Cervix:       Normal appearance by transabdominal scan. Appears
               closed, without funnelling.

 Left Ovary:    Within normal limits.
 Right Ovary:   Within normal limits.
Impression

 IUP at 13+0 weeks
 No gross abnormalities identified
 NT measurement was within normal limits for this GA; NB
 present
 Normal amniotic fluid volume
 Measurements consistent with LMP dating
Recommendations

 Offer MSAFP in the second trimester for ONTD screening
 Offer anatomy U/S by 18 weeks

## 2015-12-27 IMAGING — US US MFM OB TRANSVAGINAL
1 series · 13 of 13 positions shown · non-contrast
Comparison: none

[Series 1: us ob transvaginal · 13 acquisitions, 13 frames shown]
[im 1/13]
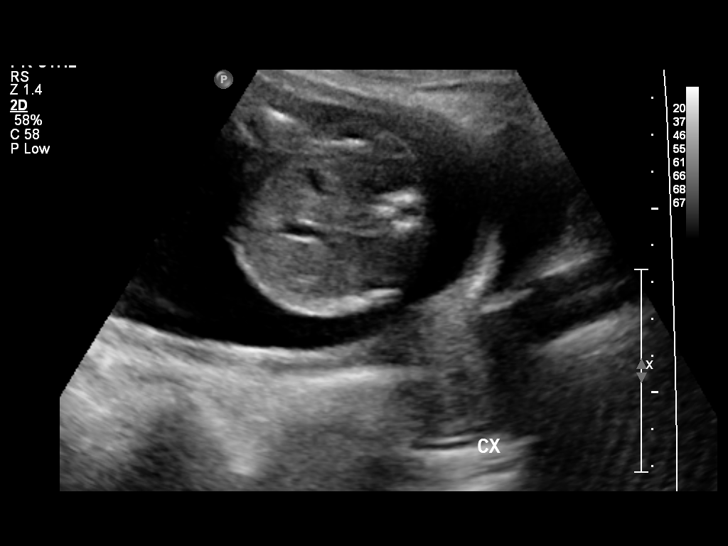
[im 2/13]
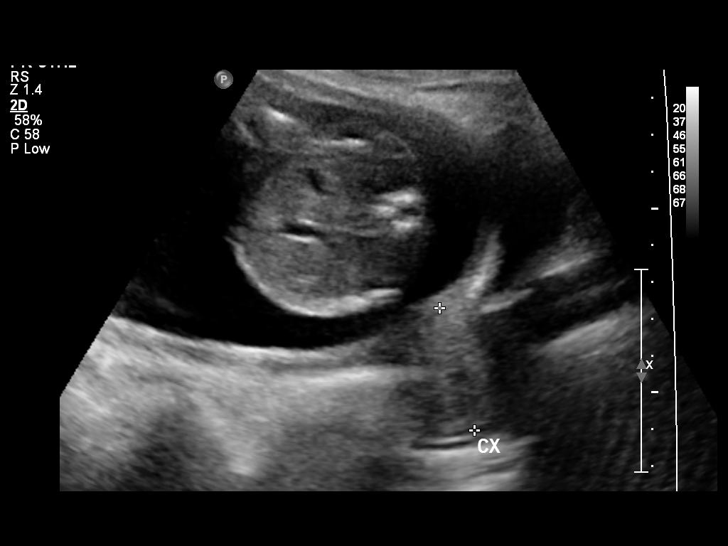
[im 3/13]
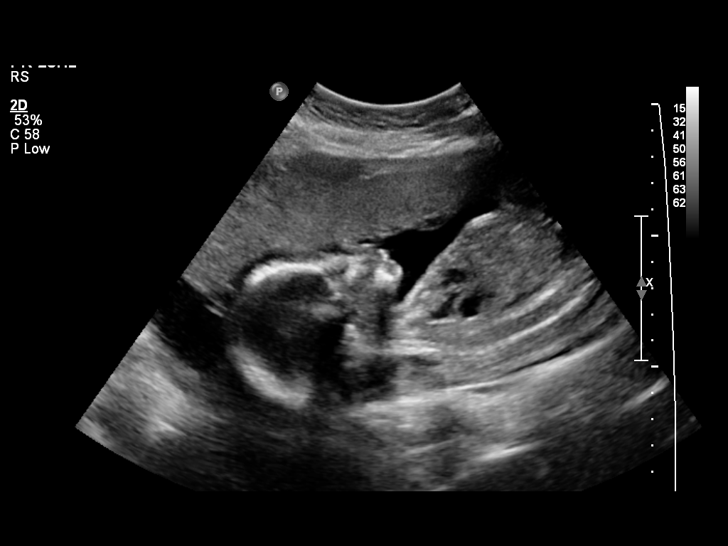
[im 4/13]
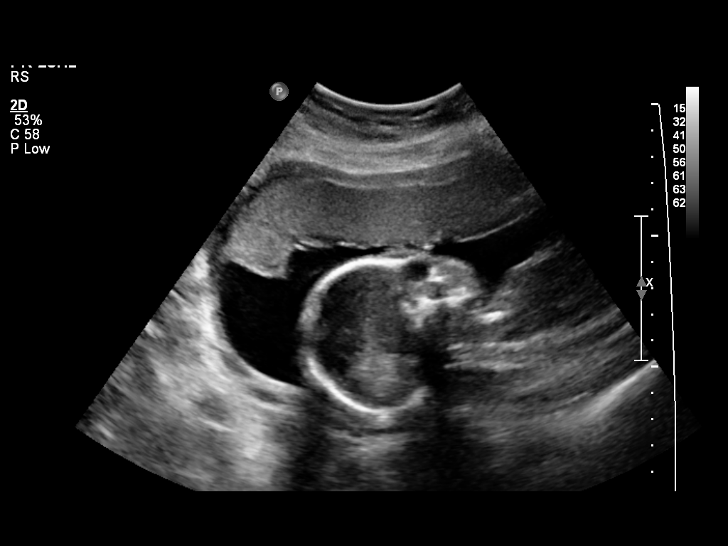
[im 5/13]
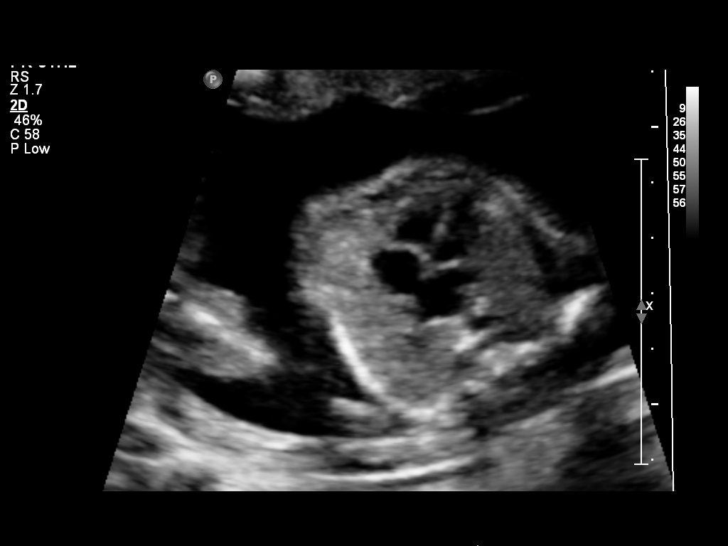
[im 6/13]
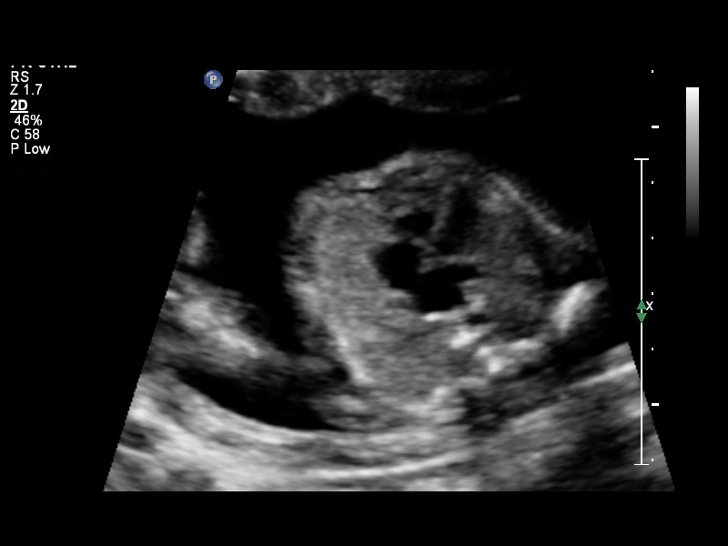
[im 7/13]
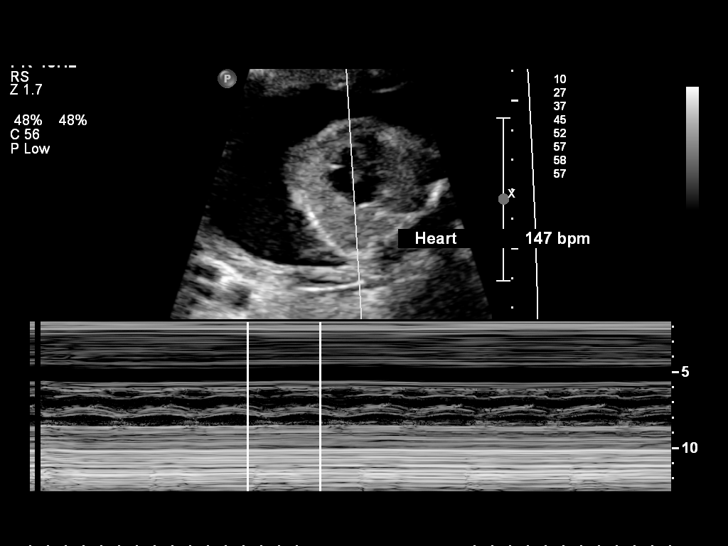
[im 8/13]
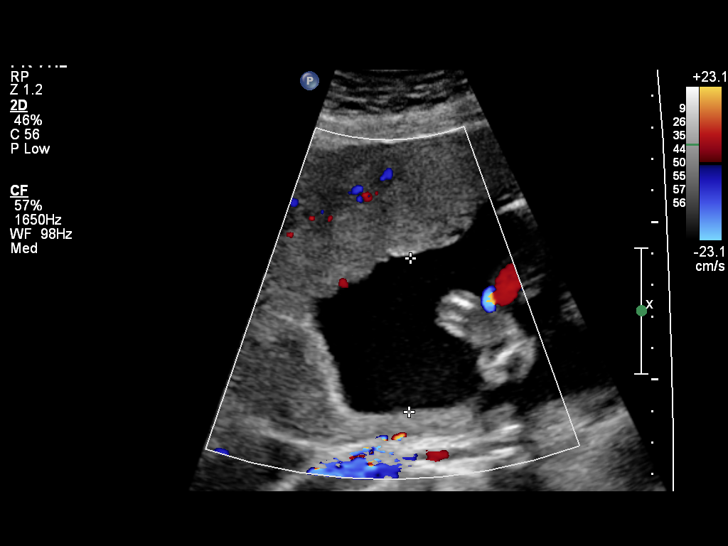
[im 9/13]
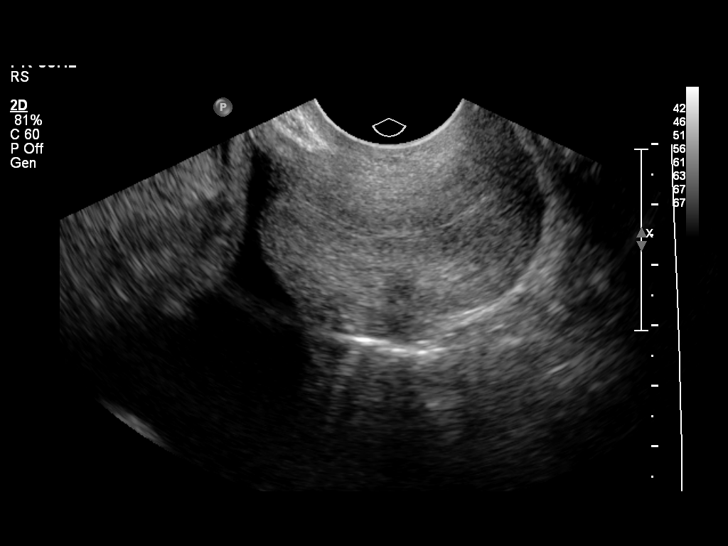
[im 10/13]
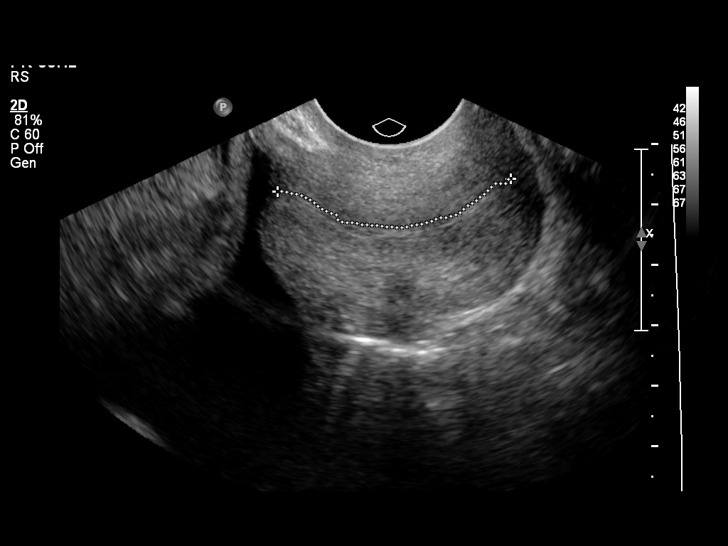
[im 11/13]
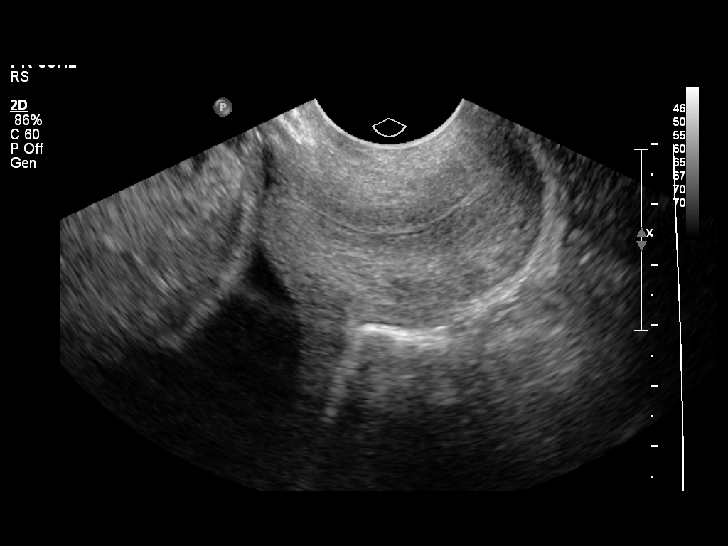
[im 12/13]
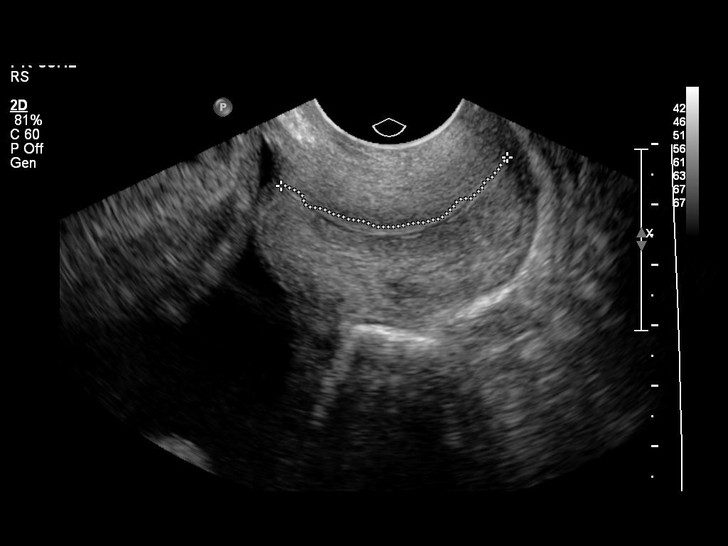
[im 13/13]
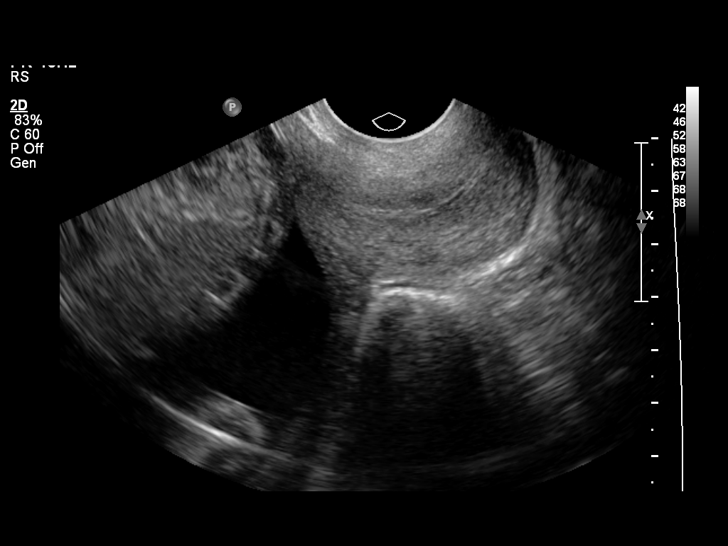

[13 of 13 positions shown; findings below may reference images not displayed]

OBSTETRICS REPORT
                      (Signed Final 08/05/2013 [DATE])

                                                         CNM
Service(s) Provided

 US OB TRANSVAGINAL                                    76817.0
Indications

 Abnormal first trimester screen ([facility] [DATE])
 Arissa Billiot  (MoM 0.25)
 Poor obstetric history: Previous preterm delivery
 (32 weeks)
 Cigarette smoker
 Previous cesarean section
Fetal Evaluation

 Num Of Fetuses:    1
 Fetal Heart Rate:  147                          bpm
 Cardiac Activity:  Observed
 Presentation:      Breech
 Placenta:          Anterior, above cervical os
 P. Cord            Previously Visualized
 Insertion:

 Amniotic Fluid
 AFI FV:      Subjectively within normal limits
                                             Larg Pckt:    4.86  cm
 RUQ:   4.86    cm
Gestational Age

 LMP:           20w 6d        Date:  03/12/13                 EDD:   12/17/13
 Best:          20w 6d     Det. By:  LMP  (03/12/13)          EDD:   12/17/13
Cervix Uterus Adnexa

 Cervical Length:    4.29     cm

 Cervix:       Normal appearance by transabdominal scan.
Impression

 SIUP at 20+6 weeks on remote interpretation of TVUS
 (remote read)
 Endovaginal imaging demonstrates a long and closed cervix,
 measuring 4.29 cm
 No previa demonstrated
 Normal amniotic fluid volume
Recommendations

 Follow-up ultrasound for growth in 6-8 weeks due to
 significantly [employer] (increased risk for third trimester
 complications; please schedule at your convenience)
